# Patient Record
Sex: Female | Born: 1945 | Race: White | Hispanic: No | Marital: Married | Smoking: Never smoker
Health system: Southern US, Community
[De-identification: ages and names within clinical notes are randomized; demographics above are authoritative.]

## PROBLEM LIST (undated history)

## (undated) DIAGNOSIS — K760 Fatty (change of) liver, not elsewhere classified: Secondary | ICD-10-CM

## (undated) DIAGNOSIS — K219 Gastro-esophageal reflux disease without esophagitis: Secondary | ICD-10-CM

## (undated) DIAGNOSIS — C439 Malignant melanoma of skin, unspecified: Secondary | ICD-10-CM

## (undated) DIAGNOSIS — M1990 Unspecified osteoarthritis, unspecified site: Secondary | ICD-10-CM

## (undated) DIAGNOSIS — E7800 Pure hypercholesterolemia, unspecified: Secondary | ICD-10-CM

## (undated) HISTORY — DX: Malignant melanoma of skin, unspecified: C43.9

## (undated) HISTORY — DX: Unspecified osteoarthritis, unspecified site: M19.90

## (undated) HISTORY — DX: Pure hypercholesterolemia, unspecified: E78.00

## (undated) HISTORY — PX: DIAPHRAGMATIC HERNIA REPAIR: SHX413

## (undated) HISTORY — DX: Fatty (change of) liver, not elsewhere classified: K76.0

## (undated) HISTORY — DX: Gastro-esophageal reflux disease without esophagitis: K21.9

---

## 1985-03-10 HISTORY — PX: APPENDECTOMY: SHX54

## 1985-03-10 HISTORY — PX: ABDOMINAL HYSTERECTOMY: SHX81

## 2008-01-12 ENCOUNTER — Ambulatory Visit: Payer: Self-pay | Admitting: Cardiology

## 2009-11-28 ENCOUNTER — Ambulatory Visit: Payer: Self-pay | Admitting: Cardiology

## 2010-06-13 DIAGNOSIS — R079 Chest pain, unspecified: Secondary | ICD-10-CM

## 2011-06-05 DIAGNOSIS — H0230 Blepharochalasis unspecified eye, unspecified eyelid: Secondary | ICD-10-CM | POA: Insufficient documentation

## 2011-08-21 DIAGNOSIS — M2240 Chondromalacia patellae, unspecified knee: Secondary | ICD-10-CM | POA: Insufficient documentation

## 2011-08-21 DIAGNOSIS — Z9889 Other specified postprocedural states: Secondary | ICD-10-CM | POA: Insufficient documentation

## 2011-08-21 DIAGNOSIS — S83249A Other tear of medial meniscus, current injury, unspecified knee, initial encounter: Secondary | ICD-10-CM | POA: Insufficient documentation

## 2011-08-21 DIAGNOSIS — M179 Osteoarthritis of knee, unspecified: Secondary | ICD-10-CM | POA: Insufficient documentation

## 2012-10-01 ENCOUNTER — Encounter: Payer: Self-pay | Admitting: Gastroenterology

## 2012-10-29 ENCOUNTER — Ambulatory Visit: Payer: Medicare Other | Admitting: Gastroenterology

## 2013-01-08 HISTORY — PX: CHOLECYSTECTOMY: SHX55

## 2014-05-05 ENCOUNTER — Encounter: Payer: Self-pay | Admitting: Gastroenterology

## 2014-06-21 ENCOUNTER — Ambulatory Visit (INDEPENDENT_AMBULATORY_CARE_PROVIDER_SITE_OTHER): Payer: Medicare Other | Admitting: Gastroenterology

## 2014-06-21 ENCOUNTER — Encounter: Payer: Self-pay | Admitting: Gastroenterology

## 2014-06-21 VITALS — BP 148/82 | HR 72 | Ht 63.75 in | Wt 190.1 lb

## 2014-06-21 DIAGNOSIS — R1010 Upper abdominal pain, unspecified: Secondary | ICD-10-CM | POA: Diagnosis not present

## 2014-06-21 DIAGNOSIS — G8929 Other chronic pain: Secondary | ICD-10-CM

## 2014-06-21 DIAGNOSIS — Z1211 Encounter for screening for malignant neoplasm of colon: Secondary | ICD-10-CM | POA: Diagnosis not present

## 2014-06-21 DIAGNOSIS — R1011 Right upper quadrant pain: Principal | ICD-10-CM

## 2014-06-21 MED ORDER — HYOSCYAMINE SULFATE 0.125 MG SL SUBL: 0.2500 mg | SUBLINGUAL_TABLET | SUBLINGUAL | Status: DC | PRN

## 2014-06-21 MED ORDER — SULINDAC 200 MG PO TABS: ORAL_TABLET | ORAL | Status: DC

## 2014-06-21 MED ORDER — PANTOPRAZOLE SODIUM 40 MG PO TBEC: 40.0000 mg | DELAYED_RELEASE_TABLET | Freq: Every day | ORAL | Status: DC

## 2014-06-21 NOTE — Progress Notes (Signed)
_                                                                                                                History of Present Illness:  Courtney Harper is a 69 year old white female referred at the request of Dr. Hampton Abbot for evaluation of abdominal pain.  Several months she's been pulling of right upper quadrant pain that may radiate to her upper midepigastrium.  It is usually preceded by belching.  It is often postprandial and occasionally awakens her.  Is also elicited with physical activity.  It is not positionally-related, per se.  It is somewhat similar to pain she had prior to her cholecystectomy.  She underwent cholecystectomy in November, 2014 right upper quadrant pain.  HIDA scan and ultrasound apparently were normal.  She denies pyrosis ,nausea, or dysphagia.  There is been no change of bowel habits.  Last colonoscopy was over 10 years ago.  Endoscopy in 2014 was normal.  She status post repair of a diaphragmatic hernia.   Past Medical History  Diagnosis Date  . Melanoma   . Hypercholesteremia   . GERD (gastroesophageal reflux disease)   . Arthritis   . Fatty liver    Past Surgical History  Procedure Laterality Date  . Cholecystectomy  01/2013  . Appendectomy  1987  . Abdominal hysterectomy  1987   family history includes Colon cancer in her maternal uncle; Diabetes in her father; Heart disease in her mother; Kidney disease in her father. There is no history of Colon polyps, Gallbladder disease, or Esophageal cancer. Current Outpatient Prescriptions  Medication Sig Dispense Refill  . simvastatin (ZOCOR) 40 MG tablet Take 40 mg by mouth daily.      No current facility-administered medications for this visit.   Allergies as of 06/21/2014  . (No Known Allergies)    reports that she has never smoked. She has never used smokeless tobacco. She reports that she does not drink alcohol or use illicit drugs.   Review of Systems: Pertinent positive and negative review  of systems were noted in the above HPI section. All other review of systems were otherwise negative.  Vital signs were reviewed in today's medical record Physical Exam: General: Well developed , well nourished, no acute distress Skin: anicteric Head: Normocephalic and atraumatic Eyes:  sclerae anicteric, EOMI Ears: Normal auditory acuity Mouth: No deformity or lesions Neck: Supple, no masses or thyromegaly Lungs: Clear throughout to auscultation Heart: Regular rate and rhythm; no murmurs, rubs or bruits Abdomen: Soft,  and non distended. No masses, hepatosplenomegaly or hernias noted. Normal Bowel sounds.  There is tenderness to palpation in the right subcostal area that mimics her pain Rectal:deferredadminister Musculoskeletal: Symmetrical with no gross deformities  Skin: No lesions on visible extremities Pulses:  Normal pulses noted Extremities: No clubbing, cyanosis, edema or deformities noted Neurological: Alert oriented x 4, grossly nonfocal Cervical Nodes:  No significant cervical adenopathy Inguinal Nodes: No significant inguinal adenopathy Psychological:  Alert and cooperative. Normal mood and affect  See Assessment  and Plan under Problem List

## 2014-06-21 NOTE — Patient Instructions (Signed)
Follow up on 08/16/2014 at 9:15am with Dr Deatra Ina We will send in your prescription to your pharmacy

## 2014-06-21 NOTE — Assessment & Plan Note (Signed)
Several month history of recurrent right upper quadrant pain that is preceded by belching.  There may be a positional component or exercise component to it.  Exam is pertinent for subcostal tenderness that mimics pain.  This raises the question of musculoskeletal pain.  That symptoms are preceded by belching also raises the question of dyspepsia.  She may have adhesions from prior surgery.  Choledocholithiasis and biliary dyskinesia are less likely.  Recommendations #1 trial of Clinoril 200 mg twice a day for 2 weeks, Protonix daily, and hyomax when necessary #2 if not improved may consider upper endoscopy and abdominal ultrasound

## 2014-06-21 NOTE — Assessment & Plan Note (Signed)
She is due for screening colonoscopy.  Will defer until abdominal pain issues are resolved.

## 2014-07-05 ENCOUNTER — Encounter: Payer: Self-pay | Admitting: Physician Assistant

## 2014-07-05 ENCOUNTER — Telehealth: Payer: Self-pay | Admitting: Gastroenterology

## 2014-07-05 NOTE — Telephone Encounter (Signed)
ok 

## 2014-07-05 NOTE — Telephone Encounter (Signed)
She had a CT done for Oncology to r/o cancerous lesion in the abd/pelvis. CT was done in [place]. She was told she has an abdominal wall hernia. She also reports her abdominal pain is not improving despite medications. She is concerned she needs surgery. I offered her an appointment with you in May. She wants to be seen earlier and asked me to schedule her with an extender which I have done. She is getting the CT report sent to [place].

## 2014-07-09 ENCOUNTER — Encounter: Payer: Self-pay | Admitting: Physician Assistant

## 2014-07-11 ENCOUNTER — Encounter: Payer: Self-pay | Admitting: Physician Assistant

## 2014-07-14 ENCOUNTER — Ambulatory Visit: Payer: Medicare Other | Admitting: Physician Assistant

## 2014-07-17 ENCOUNTER — Ambulatory Visit (INDEPENDENT_AMBULATORY_CARE_PROVIDER_SITE_OTHER): Payer: Medicare Other | Admitting: Physician Assistant

## 2014-07-17 ENCOUNTER — Encounter: Payer: Self-pay | Admitting: Physician Assistant

## 2014-07-17 ENCOUNTER — Ambulatory Visit (HOSPITAL_COMMUNITY)
Admission: RE | Admit: 2014-07-17 | Discharge: 2014-07-17 | Disposition: A | Discharge status: Home / Self Care | Payer: Medicare Other | Source: Ambulatory Visit | Attending: Physician Assistant | Admitting: Physician Assistant

## 2014-07-17 VITALS — BP 138/80 | HR 78 | Ht 63.75 in | Wt 191.0 lb

## 2014-07-17 DIAGNOSIS — R1013 Epigastric pain: Secondary | ICD-10-CM | POA: Diagnosis not present

## 2014-07-17 DIAGNOSIS — R0781 Pleurodynia: Secondary | ICD-10-CM | POA: Diagnosis present

## 2014-07-17 DIAGNOSIS — J90 Pleural effusion, not elsewhere classified: Secondary | ICD-10-CM | POA: Insufficient documentation

## 2014-07-17 DIAGNOSIS — R1011 Right upper quadrant pain: Secondary | ICD-10-CM

## 2014-07-17 DIAGNOSIS — Z85820 Personal history of malignant melanoma of skin: Secondary | ICD-10-CM | POA: Insufficient documentation

## 2014-07-17 DIAGNOSIS — Z9049 Acquired absence of other specified parts of digestive tract: Secondary | ICD-10-CM | POA: Diagnosis not present

## 2014-07-17 DIAGNOSIS — K449 Diaphragmatic hernia without obstruction or gangrene: Secondary | ICD-10-CM | POA: Insufficient documentation

## 2014-07-17 MED ORDER — PANTOPRAZOLE SODIUM 40 MG PO TBEC: 40.0000 mg | DELAYED_RELEASE_TABLET | Freq: Every day | ORAL | Status: DC

## 2014-07-17 NOTE — Progress Notes (Signed)
Patient ID: Courtney Harper, female   DOB: May 12, 1945, 69 y.o.   MRN: [id number]   Subjective:    Patient ID: Courtney Harper, female    DOB: 29-May-1945, 69 y.o.   MRN: [id number]  HPI Courtney Harper is a 69 year old white female new to Dr. Deatra Harper and initially seen in the office on 06/21/2014. At that time in referral from Dr. Hampton Harper for evaluation of right upper quadrant pain which she says started in January. She is status post cholecystectomy in 2014 and also had repair of a diaphragmatic hernia on the right several years ago. Her history is also positive for history of malignant melanoma in tablet 2002 with is followed by an oncologist Dr. Kendall Harper in [place]. Patient says she has been having ongoing pain which has progressed, and is worse, over the past 5 months. Dr. Deatra Harper had given her a trial of Protonix and Clinoril for what was felt to be musculoskeletal pain. She says she only took one of the Clinoril and it made her stomach hurt worse so she stopped. She took the Protonix for 2 weeks but did not notice any difference in her pain. She has since had labs done elsewhere on or 26 2016 with a normal CBC and see met. She had CT of the abdomen and pelvis done at that same time he got a report with her today and this does show some small fat-containing ventral abdominal wall hernias liver within normal limits Lein within normal limits small hiatal hernia and colonic diverticulosis. No findings to explain her right upper quadrant pain, no obstruction and no evidence of recurrent diaphragmatic hernia. Sliding States that she is hurting constantly in that this seems to be worse with certain movements, bending vacuuming etc. and sitting for prolonged periods especially in the car. She has not noticed any definite change with by mouth intake. No changes in her bowel habits. No nausea or vomiting. She relates that she had an EGD in 2014 in [place] prior to her gallbladder removal and that that was  unremarkable. She had colonoscopy at least 10 years ago in [place] which was negative and says she did a colon guard through Dr. Hampton Harper within the past year also negative.  Review of Systems Pertinent positive and negative review of systems were noted in the above HPI section.  All other review of systems was otherwise negative.  Outpatient Encounter Prescriptions as of 07/17/2014  Medication Sig  . simvastatin (ZOCOR) 40 MG tablet Take 40 mg by mouth daily.   . pantoprazole (PROTONIX) 40 MG tablet Take 1 tablet (40 mg total) by mouth daily.  . [DISCONTINUED] hyoscyamine (LEVSIN SL) 0.125 MG SL tablet Place 2 tablets (0.25 mg total) under the tongue every 4 (four) hours as needed.  . [DISCONTINUED] pantoprazole (PROTONIX) 40 MG tablet Take 1 tablet (40 mg total) by mouth daily.  . [DISCONTINUED] sulindac (CLINORIL) 200 MG tablet Take one tab twice a day for 2 weeks, and then as needed, abdominal pain   No facility-administered encounter medications on file as of 07/17/2014.   No Known Allergies Patient Active Problem List   Diagnosis Date Noted  . Hx of malignant melanoma of skin 07/17/2014  . Diaphragmatic hernia 07/17/2014  . S/P cholecystectomy 07/17/2014  . Abdominal pain, chronic, right upper quadrant 06/21/2014  . Colon cancer screening 06/21/2014   History   Social History  . Marital Status: Married    Spouse Name: N/A  . Number of Children: 1  . Years of  Education: N/A   Occupational History  . Retired    Social History Main Topics  . Smoking status: Never Smoker   . Smokeless tobacco: Never Used  . Alcohol Use: No  . Drug Use: No  . Sexual Activity: Not on file   Other Topics Concern  . Not on file   Social History Narrative    Ms. Courtney Harper's family history includes Diabetes in her father; Heart disease in her mother; Kidney disease in her father; Rectal cancer in her maternal uncle. There is no history of Colon polyps, Gallbladder disease, or Esophageal cancer.       Objective:    Filed Vitals:   07/17/14 1415  BP: 138/80  Pulse: 78    Physical Exam  well-developed older white female in no acute distress, accompanied by her husband blood pressure 138/80 pulse 78 height 5 foot 3 weight 191, BMI 3. HEENT: nontraumatic normocephalic EOMI PERRLA sclera anicteric, Supple: no JVD, Cardiovascular :regular rate and rhythm with S1-S2 no murmur or gallop, Pulmonary; clear bilaterally, Abdomen: soft, bowel sounds are present she is tender in the right upper quadrant is no guarding or rebound no palpable mass or hepatosplenomegaly she is also tender to palpation over the right lower anterior lateral and posterior rib margin, Rectal :exam not done, Extremities: no clubbing cyanosis or edema skin warm and dry       Assessment & Plan:   #1 69 yo female with 5 month hx of progressive RUQ pain- workup this far unrevealing- she has some musculoskeletal component , but is also tender in her abdomen. Etiology not clear No evidence of recurrent diaphragmatic hernia on Ct and small fat containing abdominal wall hernias should not cause current sxs,  #2 hx of malignant melanoma -back with + nodes 12 years ago #3 s/p GB #4 s/p repair of diaphragmatic hernia  Plan; Restart Protonix 40 mg daily Schedule for EGD asap with Dr Courtney Harper. Procedure discussed in detail with pt and she is agreeable to proceed. Schedule for right  rib details today  If above unrevealing she may need Ct of chest /thoracic spine   Courtney S Esterwood PA-C 07/17/2014   Cc: Courtney Palms, MD

## 2014-07-17 NOTE — Patient Instructions (Addendum)
You have been scheduled for an endoscopy. Please follow written instructions given to you at your visit today. If you use inhalers (even only as needed), please bring them with you on the day of your procedure. Your physician has requested that you go to www.startemmi.com and enter the access code given to you at your visit today. This web site gives a general overview about your procedure. However, you should still follow specific instructions given to you by our office regarding your preparation for the procedure.  We sent a prescription for Pantoprazole sodium 40 mg refills to [facility], [address], [address], [place], [place].  We scheduled Right rib x-rays at [facility]. You can go to the Radiology department and give then your name and Date of birth.

## 2014-07-18 ENCOUNTER — Other Ambulatory Visit: Payer: Self-pay

## 2014-07-18 ENCOUNTER — Ambulatory Visit (AMBULATORY_SURGERY_CENTER): Payer: Medicare Other | Admitting: Gastroenterology

## 2014-07-18 ENCOUNTER — Encounter: Payer: Self-pay | Admitting: Gastroenterology

## 2014-07-18 VITALS — BP 131/78 | HR 70 | Temp 97.7°F | Resp 21 | Ht 63.0 in | Wt 191.0 lb

## 2014-07-18 DIAGNOSIS — G8929 Other chronic pain: Secondary | ICD-10-CM

## 2014-07-18 DIAGNOSIS — R1013 Epigastric pain: Secondary | ICD-10-CM | POA: Diagnosis not present

## 2014-07-18 DIAGNOSIS — R1011 Right upper quadrant pain: Principal | ICD-10-CM

## 2014-07-18 DIAGNOSIS — J90 Pleural effusion, not elsewhere classified: Secondary | ICD-10-CM

## 2014-07-18 DIAGNOSIS — Z85820 Personal history of malignant melanoma of skin: Secondary | ICD-10-CM

## 2014-07-18 MED ORDER — SODIUM CHLORIDE 0.9 % IV SOLN: 500.0000 mL | INTRAVENOUS | Status: DC

## 2014-07-18 NOTE — Progress Notes (Signed)
A/ox3 pleased with MAC, report to Annette RN 

## 2014-07-18 NOTE — Op Note (Addendum)
[facility]  [address]. [place], [address]   ENDOSCOPY PROCEDURE REPORT  PATIENT: Courtney Harper, Courtney Harper  MR#: [id number] BIRTHDATE: Dec 21, 1945 , 13  yrs. old GENDER: female ENDOSCOPIST: Inda Castle, MD REFERRED BY: PROCEDURE DATE:  07/18/2014 PROCEDURE:  EGD, diagnostic ASA CLASS:     Class II INDICATIONS:  abdominal pain in the upper right quadrant. MEDICATIONS: Monitored anesthesia care and Propofol 160 mg IV TOPICAL ANESTHETIC:  DESCRIPTION OF PROCEDURE: After the risks benefits and alternatives of the procedure were thoroughly explained, informed consent was obtained.  The LB [id number] endoscope was introduced through the mouth and advanced to the second portion of the duodenum , Without limitations.  The instrument was slowly withdrawn as the mucosa was fully examined.      ESOPHAGUS: There was LA Class B esophagitis (One or more mucosal breaks > 26mm, but without continuity across mucosal folds) noted. There was a peptic stricture at the gastroesophageal junction.  The stricture was easily traversable.  Retroflexed views revealed no abnormalities.     The scope was then withdrawn from the patient and the procedure completed.  COMPLICATIONS: There were no immediate complications.  ENDOSCOPIC IMPRESSION: 1.   There was LA Class B esophagitis noted 2.   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

## 2014-07-18 NOTE — Progress Notes (Signed)
No problems noted in the recovery room. maw 

## 2014-07-18 NOTE — Progress Notes (Signed)
Beth, RN called back to recovery and spoke with Earnestine Mealing, RN reported she will speak with Dr. Deatra Ina re; CT of chest and call the pt back with the appointment. Information passed to the pt. maw

## 2014-07-18 NOTE — Progress Notes (Signed)
Reviewed and agree with management. Mirissa Lopresti D. Angelyse Heslin, M.D., FACG  

## 2014-07-18 NOTE — Patient Instructions (Signed)
YOU HAD AN ENDOSCOPIC PROCEDURE TODAY AT [facility]:   Refer to the procedure report that was given to you for any specific questions about what was found during the examination.  If the procedure report does not answer your questions, please call your gastroenterologist to clarify.  If you requested that your care partner not be given the details of your procedure findings, then the procedure report has been included in a sealed envelope for you to review at your convenience later.  YOU SHOULD EXPECT: Some feelings of bloating in the abdomen. Passage of more gas than usual.  Walking can help get rid of the air that was put into your GI tract during the procedure and reduce the bloating. If you had a lower endoscopy (such as a colonoscopy or flexible sigmoidoscopy) you may notice spotting of blood in your stool or on the toilet paper. If you underwent a bowel prep for your procedure, you may not have a normal bowel movement for a few days.  Please Note:  You might notice some irritation and congestion in your nose or some drainage.  This is from the oxygen used during your procedure.  There is no need for concern and it should clear up in a day or so.  SYMPTOMS TO REPORT IMMEDIATELY:   Following upper endoscopy (EGD)  Vomiting of blood or coffee ground material  New chest pain or pain under the shoulder blades  Painful or persistently difficult swallowing  New shortness of breath  Fever of 100F or higher  Black, tarry-looking stools  For urgent or emergent issues, a gastroenterologist can be reached at any hour by calling [phone].   DIET: Your first meal following the procedure should be a small meal and then it is ok to progress to your normal diet. Heavy or fried foods are harder to digest and may make you feel nauseous or bloated.  Likewise, meals heavy in dairy and vegetables can increase bloating.  Drink plenty of fluids but you should avoid alcoholic beverages for  24 hours.  ACTIVITY:  You should plan to take it easy for the rest of today and you should NOT DRIVE or use heavy machinery until tomorrow (because of the sedation medicines used during the test).    FOLLOW UP: Our staff will call the number listed on your records the next business day following your procedure to check on you and address any questions or concerns that you may have regarding the information given to you following your procedure. If we do not reach you, we will leave a message.  However, if you are feeling well and you are not experiencing any problems, there is no need to return our call.  We will assume that you have returned to your regular daily activities without incident.  If any biopsies were taken you will be contacted by phone or by letter within the next 1-3 weeks.  Please call us at [phone] if you have not heard about the biopsies in 3 weeks.    SIGNATURES/CONFIDENTIALITY: You and/or your care partner have signed paperwork which will be entered into your electronic medical record.  These signatures attest to the fact that that the information above on your After Visit Summary has been reviewed and is understood.  Full responsibility of the confidentiality of this discharge information lies with you and/or your care-partner.    Handouts were given to your care partner on esophagitis. You may resume your current medications today. CT of  chest with contrast for right upper quadrant pain. Please call if any questions or concerns.

## 2014-07-19 ENCOUNTER — Telehealth: Payer: Self-pay | Admitting: *Deleted

## 2014-07-19 NOTE — Telephone Encounter (Signed)
  Follow up Call-  Call back number 07/18/2014  Post procedure Call Back phone  # [phone]  Permission to leave phone message Yes     Patient questions:  Do you have a fever, pain , or abdominal swelling? No. Pain Score  0 *  Have you tolerated food without any problems? Yes.    Have you been able to return to your normal activities? Yes.    Do you have any questions about your discharge instructions: Diet   No. Medications  No. Follow up visit  No.  Do you have questions or concerns about your Care? No.  Actions: * If pain score is 4 or above: No action needed, pain <4.

## 2014-07-20 ENCOUNTER — Ambulatory Visit: Payer: Medicare Other | Admitting: Physician Assistant

## 2014-07-21 ENCOUNTER — Ambulatory Visit (INDEPENDENT_AMBULATORY_CARE_PROVIDER_SITE_OTHER)
Admission: RE | Admit: 2014-07-21 | Discharge: 2014-07-21 | Disposition: A | Discharge status: Home / Self Care | Payer: Medicare Other | Source: Ambulatory Visit | Attending: Gastroenterology | Admitting: Gastroenterology

## 2014-07-21 DIAGNOSIS — R1011 Right upper quadrant pain: Principal | ICD-10-CM

## 2014-07-21 DIAGNOSIS — Z85820 Personal history of malignant melanoma of skin: Secondary | ICD-10-CM

## 2014-07-21 DIAGNOSIS — R1010 Upper abdominal pain, unspecified: Secondary | ICD-10-CM | POA: Diagnosis not present

## 2014-07-21 DIAGNOSIS — G8929 Other chronic pain: Secondary | ICD-10-CM | POA: Diagnosis not present

## 2014-07-21 DIAGNOSIS — J90 Pleural effusion, not elsewhere classified: Secondary | ICD-10-CM | POA: Diagnosis not present

## 2014-07-21 MED ORDER — IOHEXOL 300 MG/ML  SOLN
80.0000 mL | Freq: Once | INTRAMUSCULAR | Status: AC | PRN
  Administered 2014-07-21: 80 mL via INTRAVENOUS

## 2014-07-25 ENCOUNTER — Encounter: Payer: Self-pay | Admitting: Physician Assistant

## 2014-07-27 ENCOUNTER — Telehealth: Payer: Self-pay | Admitting: Gastroenterology

## 2014-07-28 NOTE — Telephone Encounter (Signed)
Advised the report is in but the doctor has not reviewed it. She specifically asks about pleural effusion. Advised the reports states no pleural effusion and lungs are clear.

## 2014-07-31 ENCOUNTER — Encounter: Payer: Self-pay | Admitting: Physician Assistant

## 2014-08-16 ENCOUNTER — Encounter: Payer: Self-pay | Admitting: Gastroenterology

## 2014-08-16 ENCOUNTER — Ambulatory Visit (INDEPENDENT_AMBULATORY_CARE_PROVIDER_SITE_OTHER): Payer: Medicare Other | Admitting: Gastroenterology

## 2014-08-16 ENCOUNTER — Other Ambulatory Visit (INDEPENDENT_AMBULATORY_CARE_PROVIDER_SITE_OTHER): Payer: Medicare Other

## 2014-08-16 ENCOUNTER — Telehealth: Payer: Self-pay | Admitting: Gastroenterology

## 2014-08-16 ENCOUNTER — Ambulatory Visit (INDEPENDENT_AMBULATORY_CARE_PROVIDER_SITE_OTHER)
Admission: RE | Admit: 2014-08-16 | Discharge: 2014-08-16 | Disposition: A | Discharge status: Home / Self Care | Payer: Medicare Other | Source: Ambulatory Visit | Attending: Gastroenterology | Admitting: Gastroenterology

## 2014-08-16 VITALS — BP 124/76 | HR 68 | Ht 63.75 in | Wt 189.2 lb

## 2014-08-16 DIAGNOSIS — R1011 Right upper quadrant pain: Secondary | ICD-10-CM | POA: Diagnosis not present

## 2014-08-16 DIAGNOSIS — IMO0001 Reserved for inherently not codable concepts without codable children: Secondary | ICD-10-CM

## 2014-08-16 DIAGNOSIS — R079 Chest pain, unspecified: Secondary | ICD-10-CM

## 2014-08-16 DIAGNOSIS — R54 Age-related physical debility: Secondary | ICD-10-CM

## 2014-08-16 DIAGNOSIS — R0781 Pleurodynia: Secondary | ICD-10-CM | POA: Diagnosis not present

## 2014-08-16 DIAGNOSIS — R1010 Upper abdominal pain, unspecified: Secondary | ICD-10-CM

## 2014-08-16 DIAGNOSIS — Z79899 Other long term (current) drug therapy: Secondary | ICD-10-CM

## 2014-08-16 DIAGNOSIS — Z85820 Personal history of malignant melanoma of skin: Secondary | ICD-10-CM

## 2014-08-16 DIAGNOSIS — J90 Pleural effusion, not elsewhere classified: Secondary | ICD-10-CM

## 2014-08-16 DIAGNOSIS — G8929 Other chronic pain: Secondary | ICD-10-CM

## 2014-08-16 LAB — SEDIMENTATION RATE: Sed Rate: 29 mm/hr — ABNORMAL HIGH (ref 0–22)

## 2014-08-16 LAB — COMPREHENSIVE METABOLIC PANEL
ALBUMIN: 4.2 g/dL (ref 3.5–5.2)
ALT: 18 U/L (ref 0–35)
AST: 20 U/L (ref 0–37)
Alkaline Phosphatase: 137 U/L — ABNORMAL HIGH (ref 39–117)
BUN: 17 mg/dL (ref 6–23)
CO2: 31 meq/L (ref 19–32)
Calcium: 10 mg/dL (ref 8.4–10.5)
Chloride: 104 mEq/L (ref 96–112)
Creatinine, Ser: 0.91 mg/dL (ref 0.40–1.20)
GFR: 65.05 mL/min (ref >60.00)
Glucose, Bld: 93 mg/dL (ref 70–99)
POTASSIUM: 4.8 meq/L (ref 3.5–5.1)
Sodium: 137 mEq/L (ref 135–145)
Total Bilirubin: 0.6 mg/dL (ref 0.2–1.2)
Total Protein: 7.6 g/dL (ref 6.0–8.3)

## 2014-08-16 LAB — CBC WITH DIFFERENTIAL/PLATELET
Basophils Absolute: 0 10*3/uL (ref 0.0–0.1)
Basophils Relative: 0.6 % (ref 0.0–3.0)
Eosinophils Absolute: 0.2 10*3/uL (ref 0.0–0.7)
Eosinophils Relative: 2.3 % (ref 0.0–5.0)
HCT: 43.2 % (ref 36.0–46.0)
Hemoglobin: 14.8 g/dL (ref 12.0–15.0)
LYMPHS ABS: 2.1 10*3/uL (ref 0.7–4.0)
Lymphocytes Relative: 30.5 % (ref 12.0–46.0)
MCHC: 34.1 g/dL (ref 30.0–36.0)
MCV: 86.5 fl (ref 78.0–100.0)
MONOS PCT: 6.2 % (ref 3.0–12.0)
Monocytes Absolute: 0.4 10*3/uL (ref 0.1–1.0)
Neutro Abs: 4.1 10*3/uL (ref 1.4–7.7)
Neutrophils Relative %: 60.4 % (ref 43.0–77.0)
Platelets: 192 10*3/uL (ref 150.0–400.0)
RBC: 5 Mil/uL (ref 3.87–5.11)
RDW: 13 % (ref 11.5–15.5)
WBC: 6.8 10*3/uL (ref 4.0–10.5)

## 2014-08-16 LAB — BUN: BUN: 17 mg/dL (ref 6–23)

## 2014-08-16 LAB — CREATININE, SERUM: Creatinine, Ser: 0.92 mg/dL (ref 0.40–1.20)

## 2014-08-16 MED ORDER — METHYLPREDNISOLONE 4 MG PO TBPK: ORAL_TABLET | ORAL | Status: DC

## 2014-08-16 NOTE — Assessment & Plan Note (Signed)
Worsening right upper quadrant pain which increasingly appears 3 musculoskeletal in origin.  Workup including chest and abdominal CT scans and rib films were negative.  Patient also has some dyspnea on exertion that is unexplained.  Otic bone lesions are concern although not evident by x-ray.  Nerve entrapment is less likely since she has point tenderness to palpation.  Recommendations #1 CBC and comprehensive metabolic profile #2 bone scan #3 trial of Medrol dosepak

## 2014-08-16 NOTE — Progress Notes (Signed)
      History of Present Illness:  Courtney Harper continues to have pain in the right upper quadrant that is worsening.  She has pain with any activity in the right subcostal area that radiates to the subyphoid process.  Pain is worsened with movement and activity.  She took just a few doses of Clinoril which helped the pain because gastric distress causing her to discontinue this medication.  She is also complaining of dyspnea on exertion.  With films were negative.  Chest CT scan was unremarkable except for some slightly prominent lymph nodes.  Previous abdominal CT was negative by report.    Review of Systems: Pertinent positive and negative review of systems were noted in the above HPI section. All other review of systems were otherwise negative.    Current Medications, Allergies, Past Medical History, Past Surgical History, Family History and Social History were reviewed in [place] record  Vital signs were reviewed in today's medical record. Physical Exam: General: Well developed , well nourished, no acute distress Skin: anicteric Head: Normocephalic and atraumatic Eyes:  sclerae anicteric, EOMI Ears: Normal auditory acuity Mouth: No deformity or lesions Lymph Nodes: no lymphadenopathy Lungs: Clear throughout to auscultation Heart: Regular rate and rhythm; no murmurs, rubs or brui: Gastroinestinal:  Soft, non tender and non distended. No masses, hepatosplenomegaly or hernias noted. Normal Bowel sounds Rectal:deferred Musculoskeletal: Symmetrical with no gross deformities.  She is exquisitely tender along the right subcostal area up to and including the subxiphoid process Pulses:  Normal pulses noted Extremities: No clubbing, cyanosis, edema or deformities noted Neurological: Alert oriented x 4, grossly nonfocal Psychological:  Alert and cooperative. Normal mood and affect  See Assessment and Plan under Problem List

## 2014-08-16 NOTE — Patient Instructions (Signed)
Go to the basement for labs today You have been scheduled for a Bone Density  Scan on 08/18/2014 at 9am

## 2014-08-18 ENCOUNTER — Other Ambulatory Visit: Payer: Medicare Other

## 2014-08-18 NOTE — Telephone Encounter (Signed)
Please order a nuclear medicine bone scan.  You can inform her that there is no significant osteoporosis and that calcium 1200 mg per day and vitamin D 800 units per day are advised.

## 2014-08-18 NOTE — Telephone Encounter (Signed)
So what to do now? Tell her this test was normal. She has already had this test. Do I schedule her for the other? Im not sure what to do here   I sent you a staff message back on this patient

## 2014-08-21 NOTE — Telephone Encounter (Signed)
Dr Deatra Ina, They have Nuclear Bone scan 3 phase  Upper or lower or Limitied which one?

## 2014-08-21 NOTE — Telephone Encounter (Signed)
Whichever one covers the rib cage

## 2014-08-25 ENCOUNTER — Encounter: Payer: Self-pay | Admitting: Physician Assistant

## 2014-08-25 ENCOUNTER — Other Ambulatory Visit: Payer: Self-pay | Admitting: *Deleted

## 2014-08-25 ENCOUNTER — Telehealth: Payer: Self-pay | Admitting: *Deleted

## 2014-08-25 DIAGNOSIS — Z9049 Acquired absence of other specified parts of digestive tract: Secondary | ICD-10-CM

## 2014-08-25 DIAGNOSIS — G8929 Other chronic pain: Secondary | ICD-10-CM

## 2014-08-25 DIAGNOSIS — R1011 Right upper quadrant pain: Principal | ICD-10-CM

## 2014-08-25 NOTE — Telephone Encounter (Signed)
=  View-only below this line===   ----- Message -----    From: Courtney Harper [name]    Sent: 08/25/2014 11:20 AM EDT      To: [name], PA-C Subject: Non-Urgent Medical Question  Dr Deatra Ina asked me to call after I finished a methylprednisone pack  I left a message yesterday for his Nurse but did not hear back from Her I was concerned She did not get my message Unfortunately the medication has not relieved my pain  The pain level is getting stronger especially with activity and  waking me from sleeping if I get on my right side  Is it feasible to have a hida scan to rule out any chance  of problems with the bile duct since I had gallbladder surgery Thanks Courtney Harper      Per Amy schedule MRCP  Patient aware of Date and Time

## 2014-08-25 NOTE — Telephone Encounter (Signed)
Patient is scheduled at [facility] radiology on 09/01/2014 Friday 7am arrive 15 minutes early  Nothing to eat or drink after midnight   Patient aware

## 2014-09-01 ENCOUNTER — Other Ambulatory Visit: Payer: Self-pay | Admitting: Physician Assistant

## 2014-09-01 ENCOUNTER — Ambulatory Visit (HOSPITAL_COMMUNITY)
Admission: RE | Admit: 2014-09-01 | Discharge: 2014-09-01 | Disposition: A | Discharge status: Home / Self Care | Payer: Medicare Other | Source: Ambulatory Visit | Attending: Physician Assistant | Admitting: Physician Assistant

## 2014-09-01 DIAGNOSIS — D1771 Benign lipomatous neoplasm of kidney: Secondary | ICD-10-CM | POA: Insufficient documentation

## 2014-09-01 DIAGNOSIS — G8929 Other chronic pain: Secondary | ICD-10-CM | POA: Diagnosis not present

## 2014-09-01 DIAGNOSIS — Z9049 Acquired absence of other specified parts of digestive tract: Secondary | ICD-10-CM | POA: Diagnosis not present

## 2014-09-01 DIAGNOSIS — R1011 Right upper quadrant pain: Secondary | ICD-10-CM

## 2014-09-01 MED ORDER — GADOBENATE DIMEGLUMINE 529 MG/ML IV SOLN
17.0000 mL | Freq: Once | INTRAVENOUS | Status: AC | PRN
  Administered 2014-09-01: 17 mL via INTRAVENOUS

## 2014-09-04 ENCOUNTER — Encounter: Payer: Self-pay | Admitting: Physician Assistant

## 2014-09-07 ENCOUNTER — Other Ambulatory Visit: Payer: Self-pay

## 2014-09-07 DIAGNOSIS — C439 Malignant melanoma of skin, unspecified: Secondary | ICD-10-CM

## 2014-09-07 DIAGNOSIS — R0781 Pleurodynia: Secondary | ICD-10-CM

## 2014-09-14 ENCOUNTER — Ambulatory Visit (HOSPITAL_COMMUNITY)
Admission: RE | Admit: 2014-09-14 | Discharge: 2014-09-14 | Disposition: A | Discharge status: Home / Self Care | Payer: Medicare Other | Source: Ambulatory Visit | Attending: Physician Assistant | Admitting: Physician Assistant

## 2014-09-14 DIAGNOSIS — C439 Malignant melanoma of skin, unspecified: Secondary | ICD-10-CM

## 2014-09-14 DIAGNOSIS — R0781 Pleurodynia: Secondary | ICD-10-CM | POA: Diagnosis present

## 2014-09-14 MED ORDER — TECHNETIUM TC 99M MEDRONATE IV KIT
25.0000 | PACK | Freq: Once | INTRAVENOUS | Status: AC | PRN
  Administered 2014-09-14: 26.3 via INTRAVENOUS

## 2015-03-28 DIAGNOSIS — M1611 Unilateral primary osteoarthritis, right hip: Secondary | ICD-10-CM | POA: Insufficient documentation

## 2015-05-21 DIAGNOSIS — E7800 Pure hypercholesterolemia, unspecified: Secondary | ICD-10-CM | POA: Insufficient documentation

## 2015-05-21 DIAGNOSIS — K219 Gastro-esophageal reflux disease without esophagitis: Secondary | ICD-10-CM | POA: Insufficient documentation

## 2015-07-04 DIAGNOSIS — Z96659 Presence of unspecified artificial knee joint: Secondary | ICD-10-CM | POA: Insufficient documentation

## 2016-08-29 IMAGING — NM NM BONE WHOLE BODY
2 series · 2 of 2 positions shown · non-contrast
Comparison: None.

CLINICAL DATA: Right ribcage pain for the past 5 months with no
history of trauma or fracture; history of cutaneous melanoma.

EXAM:
NUCLEAR MEDICINE WHOLE BODY BONE SCAN
TECHNIQUE: Whole body anterior and posterior images were obtained approximately
3 hours after intravenous injection of radiopharmaceutical.
RADIOPHARMACEUTICALS:  26.3 mCi Aechnetium-22m MDP IV

[Series 1: whole body · 2.66mm/px · 1 of 1 slices shown (1 of 2)]
[im 1/1]
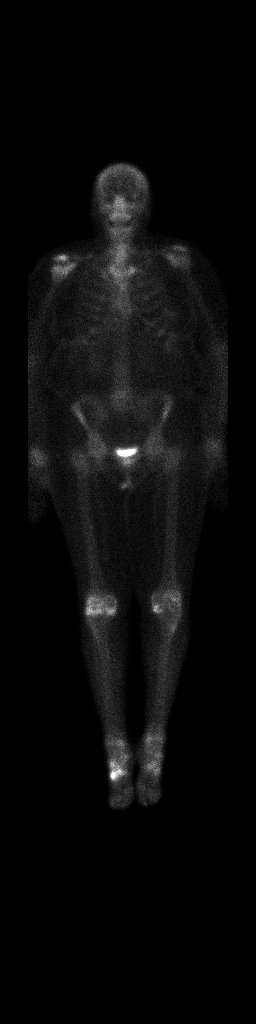

[Series 1: whole body · 2.66mm/px · 1 of 1 slices shown (2 of 2)]
[im 1/1]
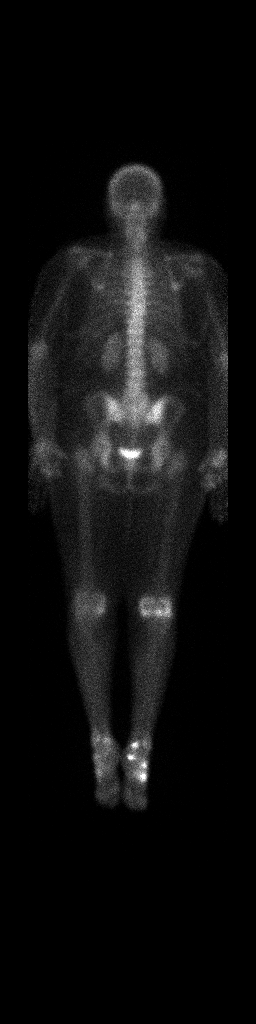

[2 of 2 positions shown; findings below may reference images not displayed]

FINDINGS: There is adequate uptake of the radiopharmaceutical by the skeleton.
Adequate soft tissue clearance and renal activity is observed.

Uptake over the ribs is normal. There is minimal increased uptake
associated with the second Marieli Pokorny junction that is likely
degenerative. Minimal uptake elsewhere in the costo chondral
junctions anteriorly is noted bilaterally. Uptake over the calvarium
and spine is within the limits of normal. No abnormal uptake is
demonstrated in the observed portions of the pectoral girdle. The
pelvis and hips are unremarkable. There is degenerative like uptake
associated with the knees and ankles.
IMPRESSION: There are no bone scan findings to suggest metastatic disease to the
skeleton. There are areas of abnormal increased uptake as described
most compatible with degenerative change with the right ankle and
hindfoot being most significantly affected.

Plain films of the right ankle and foot are recommended.

## 2020-10-23 DIAGNOSIS — H9313 Tinnitus, bilateral: Secondary | ICD-10-CM | POA: Insufficient documentation

## 2020-10-23 DIAGNOSIS — H608X3 Other otitis externa, bilateral: Secondary | ICD-10-CM | POA: Insufficient documentation

## 2020-10-23 DIAGNOSIS — J310 Chronic rhinitis: Secondary | ICD-10-CM | POA: Insufficient documentation

## 2021-04-18 DIAGNOSIS — R053 Chronic cough: Secondary | ICD-10-CM | POA: Insufficient documentation

## 2021-06-07 DIAGNOSIS — R0609 Other forms of dyspnea: Secondary | ICD-10-CM | POA: Insufficient documentation

## 2022-02-05 DIAGNOSIS — M6702 Short Achilles tendon (acquired), left ankle: Secondary | ICD-10-CM | POA: Insufficient documentation

## 2022-02-05 DIAGNOSIS — M7730 Calcaneal spur, unspecified foot: Secondary | ICD-10-CM | POA: Insufficient documentation

## 2022-04-16 DIAGNOSIS — M6788 Other specified disorders of synovium and tendon, other site: Secondary | ICD-10-CM | POA: Insufficient documentation

## 2022-04-29 DIAGNOSIS — M1990 Unspecified osteoarthritis, unspecified site: Secondary | ICD-10-CM | POA: Insufficient documentation

## 2022-04-29 DIAGNOSIS — E669 Obesity, unspecified: Secondary | ICD-10-CM | POA: Insufficient documentation

## 2022-04-29 DIAGNOSIS — I10 Essential (primary) hypertension: Secondary | ICD-10-CM | POA: Insufficient documentation

## 2022-04-29 DIAGNOSIS — C4359 Malignant melanoma of other part of trunk: Secondary | ICD-10-CM | POA: Insufficient documentation

## 2022-10-09 ENCOUNTER — Encounter: Payer: Self-pay | Admitting: Internal Medicine

## 2022-11-18 ENCOUNTER — Encounter: Payer: Self-pay | Admitting: Internal Medicine

## 2022-11-18 ENCOUNTER — Ambulatory Visit: Payer: Medicare Other | Attending: Internal Medicine | Admitting: Internal Medicine

## 2022-11-18 VITALS — BP 118/70 | HR 78 | Ht 63.0 in | Wt 172.0 lb

## 2022-11-18 DIAGNOSIS — Z136 Encounter for screening for cardiovascular disorders: Secondary | ICD-10-CM | POA: Insufficient documentation

## 2022-11-18 DIAGNOSIS — E785 Hyperlipidemia, unspecified: Secondary | ICD-10-CM | POA: Insufficient documentation

## 2022-11-18 DIAGNOSIS — I700 Atherosclerosis of aorta: Secondary | ICD-10-CM | POA: Diagnosis present

## 2022-11-18 DIAGNOSIS — E7849 Other hyperlipidemia: Secondary | ICD-10-CM | POA: Insufficient documentation

## 2022-11-18 NOTE — Patient Instructions (Signed)
Medication Instructions:  Your physician recommends that you continue on your current medications as directed. Please refer to the Current Medication list given to you today.   Labwork: None  Testing/Procedures: Your physician has requested that you have an abdominal aorta duplex. During this test, an ultrasound is used to evaluate the aorta. Allow 30 minutes for this exam. Do not eat after midnight the day before and avoid carbonated beverages   Follow-Up: Your physician recommends that you schedule a follow-up appointment in: Pending Results  Any Other Special Instructions Will Be Listed Below (If Applicable).  If you need a refill on your cardiac medications before your next appointment, please call your pharmacy.

## 2022-11-18 NOTE — Progress Notes (Signed)
Cardiology Office Note  Date: 11/18/2022   ID: Courtney Harper, DOB Nov 27, 1945, MRN [id number]  PCP:  Ardyth Man, MD  Cardiologist:  Marjo Bicker, MD Electrophysiologist:  None   History of Present Illness: Courtney Harper is a 77 y.o. 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  Overall doing great, never had any ischemia evaluation before.  No symptoms of angina, DOE, orthopnea, PND, leg swelling.  No palpitations, dizziness, presyncope and syncope.   Past Medical History:  Diagnosis Date   Arthritis    Fatty liver    GERD (gastroesophageal reflux disease)    Hypercholesteremia    Melanoma (HCC)     Past Surgical History:  Procedure Laterality Date   ABDOMINAL HYSTERECTOMY  1987   APPENDECTOMY  1987   CHOLECYSTECTOMY  01/2013   DIAPHRAGMATIC HERNIA REPAIR      Current Outpatient Medications  Medication Sig Dispense Refill   atorvastatin (LIPITOR) 20 MG tablet Take 20 mg by mouth daily.     gabapentin (NEURONTIN) 100 MG capsule Take 100 mg by mouth 3 (three) times daily.     No current facility-administered medications for this visit.   Allergies:  Patient has no known allergies.   Social History: The patient  reports that she has never smoked. She has never used smokeless tobacco. She reports that she does not drink alcohol and does not use drugs.   Family History: The patient's family history includes Diabetes in her father; Heart disease in her mother; Kidney disease in her father; Rectal cancer in her maternal uncle.   ROS:  Please see the history of present illness. Otherwise, complete review of systems is positive for none  All other systems are reviewed and negative.   Physical Exam: VS:  BP 118/70   Pulse 78   Ht 5\' 3"  (1.6 m)   Wt 172 lb (78 kg)   SpO2 97%   BMI 30.47 kg/m , BMI Body mass index is 30.47 kg/m.  Wt  Readings from Last 3 Encounters:  11/18/22 172 lb (78 kg)  08/16/14 189 lb 3.2 oz (85.8 kg)  07/18/14 191 lb (86.6 kg)    General: Patient appears comfortable at rest. HEENT: Conjunctiva and lids normal, oropharynx clear with moist mucosa. Neck: Supple, no elevated JVP or carotid bruits, no thyromegaly. Lungs: Clear to auscultation, nonlabored breathing at rest. Cardiac: Regular rate and rhythm, no S3 or significant systolic murmur, no pericardial rub. Abdomen: Soft, nontender, no hepatomegaly, bowel sounds present, no guarding or rebound. Extremities: No pitting edema, distal pulses 2+. Skin: Warm and dry. Musculoskeletal: No kyphosis. Neuropsychiatric: Alert and oriented x3, affect grossly appropriate.  Recent Labwork: No results found for requested labs within last 365 days.  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  HLD, at goal: Reviewed recent lipid panel that showed LDL 110 and normal triglycerides.  Continue atorvastatin 20 mg nightly which she has been since 1991.  Follows up with PCP.      Medication Adjustments/Labs and Tests Ordered: Current medicines are reviewed at length with the patient today.  Concerns regarding medicines are outlined above.    Disposition:  Follow up  pending results  Signed Suzannah Bettes Verne Spurr, MD, 11/18/2022 3:22 PM  [facility] at [facility] [address] [place], [place], [place] [address]

## 2022-12-04 ENCOUNTER — Ambulatory Visit: Payer: Medicare Other | Attending: Internal Medicine

## 2022-12-04 DIAGNOSIS — I700 Atherosclerosis of aorta: Secondary | ICD-10-CM

## 2023-01-12 ENCOUNTER — Other Ambulatory Visit: Payer: Self-pay

## 2023-01-12 DIAGNOSIS — C434 Malignant melanoma of scalp and neck: Secondary | ICD-10-CM | POA: Insufficient documentation

## 2023-01-12 DIAGNOSIS — H539 Unspecified visual disturbance: Secondary | ICD-10-CM

## 2023-01-12 NOTE — Progress Notes (Unsigned)
Patient name: Courtney Harper MRN: [id number] DOB: 06/20/1945 Sex: female  REASON FOR CONSULT: Partial visual field loss, evaluate for TIA  HPI: Courtney Harper is a 77 y.o. female, with history of hyperlipidemia that presents for evaluation of partial visual field loss.  Patient states that she had visual field loss in her left eye that happened on about 3 different occasions in early September.  She states she has had no further episodes although her vision remains blurry at times.  She was evaluated by an ophthalmologist and underwent a dilated eye exam that was otherwise normal.  She was sent to [place] to rule out TIA versus ocular migraine.  Reports no other focal neurologic events.  She has no history of prior TIA or stroke.  Past Medical History:  Diagnosis Date   Arthritis    Fatty liver    GERD (gastroesophageal reflux disease)    Hypercholesteremia    Melanoma (HCC)     Past Surgical History:  Procedure Laterality Date   ABDOMINAL HYSTERECTOMY  1987   APPENDECTOMY  1987   CHOLECYSTECTOMY  01/2013   DIAPHRAGMATIC HERNIA REPAIR      Family History  Problem Relation Age of Onset   Rectal cancer Maternal Uncle        age 8's dx   Colon polyps Neg Hx    Diabetes Father    Kidney disease Father    Gallbladder disease Neg Hx    Esophageal cancer Neg Hx    Heart disease Mother     SOCIAL HISTORY: Social History   Socioeconomic History   Marital status: Married    Spouse name: Not on file   Number of children: 1   Years of education: Not on file   Highest education level: Not on file  Occupational History   Occupation: Retired  Tobacco Use   Smoking status: Never   Smokeless tobacco: Never  Substance and Sexual Activity   Alcohol use: No    Alcohol/week: 0.0 standard drinks of alcohol   Drug use: No   Sexual activity: Not on file  Other Topics Concern   Not on file  Social History Narrative   Not on file   Social Determinants of Health    Financial Resource Strain: Not on file  Food Insecurity: Not on file  Transportation Needs: Not on file  Physical Activity: Not on file  Stress: Not on file  Social Connections: Not on file  Intimate Partner Violence: Not on file    Allergies  Allergen Reactions   Bee Venom Rash and Swelling    Current Outpatient Medications  Medication Sig Dispense Refill   atorvastatin (LIPITOR) 20 MG tablet Take 20 mg by mouth daily.     gabapentin (NEURONTIN) 100 MG capsule Take 100 mg by mouth 3 (three) times daily.     No current facility-administered medications for this visit.    REVIEW OF SYSTEMS:  [X]  denotes positive finding, [ ]  denotes negative finding Cardiac  Comments:  Chest pain or chest pressure:    Shortness of breath upon exertion:    Short of breath when lying flat:    Irregular heart rhythm:        Vascular    Pain in calf, thigh, or hip brought on by ambulation:    Pain in feet at night that wakes you up from your sleep:     Blood clot in your veins:    Leg swelling:  Pulmonary    Oxygen at home:    Productive cough:     Wheezing:         Neurologic    Sudden weakness in arms or legs:     Sudden numbness in arms or legs:     Sudden onset of difficulty speaking or slurred speech:    Temporary loss of vision in one eye:     Problems with dizziness:         Gastrointestinal    Blood in stool:     Vomited blood:         Genitourinary    Burning when urinating:     Blood in urine:        Psychiatric    Major depression:         Hematologic    Bleeding problems:    Problems with blood clotting too easily:        Skin    Rashes or ulcers:        Constitutional    Fever or chills:      PHYSICAL EXAM: There were no vitals filed for this visit.  GENERAL: The patient is a well-nourished female, in no acute distress. The vital signs are documented above. CARDIAC: There is a regular rate and rhythm.  VASCULAR:  No prior neck  incisions PULMONARY: No respiratory distress. ABDOMEN: Soft and non-tender. MUSCULOSKELETAL: There are no major deformities or cyanosis. NEUROLOGIC: No focal weakness or paresthesias are detected.  Cranial nerves II through XII grossly intact. SKIN: There are no ulcers or rashes noted. PSYCHIATRIC: The patient has a normal affect.  DATA:   Carotid duplex today shows 1 to 39% stenosis bilaterally  Assessment/Plan:  77 y.o. female, with history of hyperlipidemia that presents for evaluation of partial visual field loss and further evaluation of possible TIA.  She had 3 episodes in early September with partial vision loss in the left eye that was brief.  This has since resolved with no further episodes over the last several months.  Her symptoms could certainly be consistent with a TIA.  However, her carotid duplex today shows minimal 1 to 39% stenosis in bilateral carotids.  She also had dilated ocular exam that showed normal ocular health and I would have expected stroke or TIA to have some abnormal finding on her eye exam.  With minimal carotid disease bilaterally there is no role for surgical intervention.  I discussed 81 mg aspirin for risk reduction.  She is already on statin therapy.  I will see her in 2 years with repeat carotid duplex.  I discussed the current guidelines are surgical intervention for greater than 50% carotid stenosis if felt her carotid disease is symptomatic and again she does not meet criteria. Will pursue surveillance   Cephus Shelling, MD [facility] Office: [phone]

## 2023-01-13 ENCOUNTER — Encounter: Payer: Self-pay | Admitting: Vascular Surgery

## 2023-01-13 ENCOUNTER — Ambulatory Visit (INDEPENDENT_AMBULATORY_CARE_PROVIDER_SITE_OTHER): Payer: Medicare Other

## 2023-01-13 ENCOUNTER — Ambulatory Visit (INDEPENDENT_AMBULATORY_CARE_PROVIDER_SITE_OTHER): Payer: Medicare Other | Admitting: Vascular Surgery

## 2023-01-13 VITALS — BP 149/84 | HR 79 | Temp 98.1°F | Ht 63.0 in | Wt 159.0 lb

## 2023-01-13 DIAGNOSIS — I6523 Occlusion and stenosis of bilateral carotid arteries: Secondary | ICD-10-CM | POA: Diagnosis not present

## 2023-01-13 DIAGNOSIS — H539 Unspecified visual disturbance: Secondary | ICD-10-CM | POA: Diagnosis not present

## 2023-01-13 DIAGNOSIS — I779 Disorder of arteries and arterioles, unspecified: Secondary | ICD-10-CM | POA: Insufficient documentation

## 2023-02-11 ENCOUNTER — Encounter: Payer: Self-pay | Admitting: Neurology

## 2023-02-25 NOTE — Progress Notes (Signed)
Initial neurology clinic note  Courtney Harper MRN: [id number] DOB: [birth year]  Referring provider: Ardyth Man, MD  Primary care provider: Ardyth Man, MD  Reason for consult:  blurry vision, vision loss  Subjective:  This is Courtney Harper, a 77 y.o. right-handed female with a medical history of HLD, OA, GERD, melanoma, fatty liver, pituitary problems in 1970s who presents to neurology clinic with blurry vision, vision loss. The patient is accompanied by husband.  Patient took gabapentin given by ENT for chronic cough for 14 months. She stopped in 11/2022. On 12/10/22 patient was watching TV and noticed acute onset right sided vision loss. It was present when covering either eye. It lasted a couple of hours then resolved. She had no further neurologic symptoms.  2 days later she started having hot flashes. She then started having very blurry vision. She cannot read well or see the TV. This does not come and go. She also endorses lightheadedness and blurry vision. She denies pain or headache. She is not a headache person. She will have pressure on the medial aspect of both eyes. She also has very watery eyes bilaterally. This will last about 10 minutes. She has chronically had phonophobia, but more recently had photophobia. She denies nausea and vomiting. She has noticed her BP will be high (SBP in 150s) during hot flashes. Per patient, this has not been looked into.  Of note, patient had lost total vision while on interferon for melanoma in 2002. It lasted two days then symptoms resolved. Patient mentions an episode of passing out in 1973 and Harper to have pituitary problem. She was on hormones for a while and never had recurrence.  She was evaluated by ophthalmology were testing was normal. Her visual acuity was not checked per patient. She still has blurry vision even when wearing her glasses, which husband says she does not do often. Patient had concerns about pituitary  and thyroid so prolactin and TSH were checked and were normal.  Of note, patient was seen by Dr. Chestine Spore in vascular surgery on 01/13/23 for partial visual field loss in 11/2022 to rule out TIA. There was no significant carotid disease seen though. Patient has been taking aspirin 81 mg daily since this visit. She has been on Lipitor 20 mg daily since the 1990s.  Patient was given Bernita Raisin (sample) but patient has never taken as she does not have a headache.  She denies ptosis, dysarthria, dysphagia, difficulty swallowing, orthopnea, and no weakness of arms or legs. Her blurry vision does not fluctuate.  She denies fevers, temporal pain, or jaw claudication.  Caffeine use: 2 cups of coffee per day Non-smoker EtOH use: rare glass of wine Restrictive diet: on weight watchers, has lost 35 lbs on purpose the last year Family history of neurologic disease: father had a stroke  MEDICATIONS:  Outpatient Encounter Medications as of 02/27/2023  Medication Sig   aspirin EC (ASPIR-LOW) 81 MG tablet    atorvastatin (LIPITOR) 20 MG tablet Take 20 mg by mouth daily.   [DISCONTINUED] gabapentin (NEURONTIN) 100 MG capsule Take 100 mg by mouth 3 (three) times daily. (Patient not taking: Reported on 02/27/2023)   No facility-administered encounter medications on file as of 02/27/2023.    PAST MEDICAL HISTORY: Past Medical History:  Diagnosis Date   Arthritis    Fatty liver    GERD (gastroesophageal reflux disease)    Hypercholesteremia    Melanoma (HCC)     PAST SURGICAL HISTORY:  Past Surgical History:  Procedure Laterality Date   ABDOMINAL HYSTERECTOMY  1987   APPENDECTOMY  1987   CHOLECYSTECTOMY  01/2013   DIAPHRAGMATIC HERNIA REPAIR      ALLERGIES: Allergies  Allergen Reactions   Bee Venom Rash and Swelling    FAMILY HISTORY: Family History  Problem Relation Age of Onset   Heart disease Mother    Diabetes Father    Kidney disease Father    Rectal cancer Maternal Uncle        age  54's dx   Colon polyps Neg Hx    Gallbladder disease Neg Hx    Esophageal cancer Neg Hx     SOCIAL HISTORY: Social History   Tobacco Use   Smoking status: Never   Smokeless tobacco: Never  Vaping Use   Vaping status: Never Used  Substance Use Topics   Alcohol use: No    Alcohol/week: 0.0 standard drinks of alcohol   Drug use: No   Social History   Social History Narrative   Are you right handed or left handed? Right Handed    Are you currently employed ? No    What is your current occupation? Retired after 28 years   Do you live at home alone? No   Who lives with you? With husband Gerlene Burdock    What type of home do you live in: 1 story or 2 story? Lives in a one story home        Objective:  Vital Signs:  BP (!) 151/74   Pulse 72   Ht 5\' 3"  (1.6 m)   Wt 165 lb (74.8 kg)   SpO2 98%   BMI 29.23 kg/m   General: General appearance: Awake and alert. No distress. Cooperative with exam.  Skin: No obvious rash or jaundice. HEENT: Atraumatic. Anicteric. Lungs: Non-labored breathing on room air  Heart: Regular. No carotid bruits. Extremities: No edema. Psych: Affect appropriate.  Neurological: Mental Status: Alert. Speech fluent. No pseudobulbar affect Cranial Nerves: CNII: No RAPD. Visual fields intact. CNIII, IV, VI: PERRL. No nystagmus. EOMI. Difficulty sustaining up gaze. CN V: Facial sensation intact bilaterally to fine touch CN VII: Orbicularis oculi mildly weak. Orbicularis oris strong. No ptosis at rest or after sustained upgaze. CN VIII: Hears finger rub well bilaterally. CN IX: No hypophonia. CN X: Palate elevates symmetrically. CN XI: Full strength shoulder shrug bilaterally. CN XII: Tongue protrusion full and midline. No atrophy or fasciculations. No significant dysarthria Motor: Tone: paratonia in all extremities. No fasciculations in extremities. No atrophy.  Individual muscle group testing (MRC grade out of 5):  Movement     Neck flexion 5     Neck extension 5     Right Left   Shoulder abduction 5- 5- ?fatigable  Elbow flexion 5 5   Elbow extension 5 5   Finger abduction - FDI 5 5   Finger abduction - ADM 5 5   Finger extension 5 5   Finger distal flexion - 2/3 5 5    Finger distal flexion - 4/5 5 5    Thumb flexion - FPL 5 5   Thumb abduction - APB 5- 5- fatigable   Hip flexion 4+ 4+   Hip extension 5 5   Hip adduction 5 5   Hip abduction 5 5   Knee extension 5 5   Knee flexion 5 5   Dorsiflexion 5 5   Plantarflexion 5 5     Reflexes:  Right Left   Bicep 2+ 2+  Tricep 2+ 2+   BrRad 2+ 2+   Knee 2+ 2+   Ankle Trace Trace    Sensation: Pinprick intact in all extremities Coordination: Intact finger-to- nose-finger bilaterally. Romberg negative. Finger tapping normal. Gait: Able to rise from chair with arms crossed unassisted. Normal, narrow-based gait..   Labs and Imaging review: External labs: TSH (09/25/22): wnl Prolactin (09/25/22): wnl HbA1c (10/21/21): 5.4 CMP (10/21/21): alk phos mildly elevated to 177, otherwise unremarkable CBC (10/21/21): unremarkable Lipid panel (10/21/21): tChol 168, LDL 110, TG 66  Imaging: Carotid ultrasound (01/13/23): Summary:  Right Carotid: There is no evidence of stenosis in the right ICA.   Left Carotid: Velocities in the left ICA are consistent with a 1-39%  stenosis.   Vertebrals: Bilateral vertebral arteries demonstrate antegrade flow.  Subclavians: Normal flow hemodynamics were seen in bilateral subclavian               arteries.   Assessment/Plan:  Lisette Vandenbroek is a 77 y.o. female who presents for evaluation of episode of right sided vision loss lasting 2 hours and more recent, constant blurry vision, hot flashes, lightheadedness. 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 Her neurological examination is pertinent for eye closure weakness, proximal muscle weakness of legs, and possible fatigable  weakness in the arms. Available diagnostic data is significant for recent TSH and prolactin being normal. Normal Carotid ultrasound. Patient's initial episode of right sided vision loss that lasted 2 hours sounds consistent with right homonomous hemianopia and concerning for TIA or stroke. The more recent blurry vision is less clear. This could be as benign as needed new glasses, but given possible fatigable weakness seen on exam, I also consider myasthenia gravis as possible. How the hot flashes and lightheadedness would be related to MG would be less clear. A intracranial pathology is also possible, so MRI is warranted for multiple reasons. I would like her to continue asa 81 mg and lipitor 20 mg daily for now, treating the initial episode as a TIA.   PLAN: -Blood work: B1, B12, AChR abs -Patient will get me most recent lipid panel. May need to adjust lipitor. -MRI brain and orbits w/wo contrast  -Continue aspirin 81 mg daily -Continue Lipitor 20 mg daily for now   -Return to clinic in 3 months  The impression above as well as the plan as outlined below were extensively discussed with the patient (in the company of husband) who voiced understanding. All questions were answered to their satisfaction.  When available, results of the above investigations and possible further recommendations will be communicated to the patient via telephone/MyChart. Patient to call office if not contacted after expected testing turnaround time.   Total time spent reviewing records, interview, history/exam, documentation, and coordination of care on day of encounter:  60 min   Thank you for allowing me to participate in patient's care.  If I can answer any additional questions, I would be pleased to do so.  Jacquelyne Balint, MD   CC: Park, Nettie Elm, MD [address] [place] [address]  CC: Referring provider: Ardyth Man, MD [address] [place],  [place] [address]

## 2023-02-27 ENCOUNTER — Ambulatory Visit (INDEPENDENT_AMBULATORY_CARE_PROVIDER_SITE_OTHER): Payer: Medicare Other | Admitting: Neurology

## 2023-02-27 ENCOUNTER — Other Ambulatory Visit: Payer: Medicare Other

## 2023-02-27 ENCOUNTER — Encounter: Payer: Self-pay | Admitting: Neurology

## 2023-02-27 VITALS — BP 151/74 | HR 72 | Ht 63.0 in | Wt 165.0 lb

## 2023-02-27 DIAGNOSIS — G459 Transient cerebral ischemic attack, unspecified: Secondary | ICD-10-CM | POA: Diagnosis not present

## 2023-02-27 DIAGNOSIS — H53461 Homonymous bilateral field defects, right side: Secondary | ICD-10-CM

## 2023-02-27 DIAGNOSIS — H538 Other visual disturbances: Secondary | ICD-10-CM

## 2023-02-27 NOTE — Patient Instructions (Addendum)
I saw you today for vision changes (right vision loss and now blurry vision). I am not sure the cause of your symptoms, so I need to do further investigations.  I would like to get blood work today. I also want to get an MRI of your brain and your orbits (eyes). I will be in touch when I have those results to discuss next steps.  Continue aspirin 81 mg and lipitor 20 mg daily. I am concerned the initial vision loss on the right was a stroke or TIA. We will know if it was a stroke on MRI. I want to treat those symptoms as if they were though.  If you have new or worsening symptoms let me know.  I will see you back in clinic in 3 months to re-examine you.  The physicians and staff at [facility] are committed to providing excellent care. You may receive a survey requesting feedback about your experience at our office. We strive to receive "very good" responses to the survey questions. If you feel that your experience would prevent you from giving the office a "very good " response, please contact our office to try to remedy the situation. We may be reached at [phone]. Thank you for taking the time out of your busy day to complete the survey.  Jacquelyne Balint, MD [facility]

## 2023-03-02 ENCOUNTER — Other Ambulatory Visit: Payer: Self-pay

## 2023-03-02 DIAGNOSIS — H53461 Homonymous bilateral field defects, right side: Secondary | ICD-10-CM

## 2023-03-02 DIAGNOSIS — G459 Transient cerebral ischemic attack, unspecified: Secondary | ICD-10-CM

## 2023-03-02 DIAGNOSIS — H538 Other visual disturbances: Secondary | ICD-10-CM

## 2023-03-05 ENCOUNTER — Telehealth: Payer: Self-pay

## 2023-03-05 NOTE — Telephone Encounter (Signed)
Called patient to remind her of Labs she is getting it done in [place] at [facility] .

## 2023-03-05 NOTE — Telephone Encounter (Signed)
-----   Message from Antony Madura sent at 03/02/2023 10:53 AM EST ----- Regarding: Lipid panel order Could you order a lipid panel for this patient? I sent her a message already letting her know I was going to order it.  Thank you,  Riki Rusk

## 2023-03-06 ENCOUNTER — Other Ambulatory Visit (HOSPITAL_COMMUNITY)
Admission: RE | Admit: 2023-03-06 | Discharge: 2023-03-06 | Disposition: A | Discharge status: Home / Self Care | Payer: Medicare Other | Source: Ambulatory Visit | Attending: Neurology | Admitting: Neurology

## 2023-03-06 ENCOUNTER — Encounter: Payer: Self-pay | Admitting: Neurology

## 2023-03-06 ENCOUNTER — Other Ambulatory Visit: Payer: Self-pay | Admitting: Neurology

## 2023-03-06 DIAGNOSIS — H53461 Homonymous bilateral field defects, right side: Secondary | ICD-10-CM | POA: Diagnosis not present

## 2023-03-06 DIAGNOSIS — G459 Transient cerebral ischemic attack, unspecified: Secondary | ICD-10-CM | POA: Diagnosis not present

## 2023-03-06 DIAGNOSIS — H538 Other visual disturbances: Secondary | ICD-10-CM | POA: Diagnosis present

## 2023-03-06 LAB — LIPID PANEL
Cholesterol: 173 mg/dL (ref 0–200)
HDL: 48 mg/dL (ref >40)
LDL Cholesterol: 118 mg/dL — ABNORMAL HIGH (ref 0–99)
Total CHOL/HDL Ratio: 3.6 {ratio}
Triglycerides: 33 mg/dL (ref <150)
VLDL: 7 mg/dL (ref 0–40)

## 2023-03-06 MED ORDER — ATORVASTATIN CALCIUM 40 MG PO TABS: 40.0000 mg | ORAL_TABLET | Freq: Every day | ORAL | 3 refills | Status: DC

## 2023-03-09 LAB — VITAMIN B12: Vitamin B-12: 404 pg/mL (ref 200–1100)

## 2023-03-09 LAB — MUSK ANTIBODY TEST
Interpretation: NEGATIVE
Technical Results: <1:10 {titer}

## 2023-03-09 LAB — MYASTHENIA GRAVIS PANEL 2 W/ RFLX MUSK ANTIBODY
A CHR BINDING ABS: <0.3 nmol/L
ACHR Blocking Abs: <15 %{inhibition} (ref <15)
Acetylchol Modul Ab: 25 %{inhibition}

## 2023-03-09 LAB — VITAMIN B1: Vitamin B1 (Thiamine): 13 nmol/L (ref 8–30)

## 2023-03-20 ENCOUNTER — Ambulatory Visit
Admission: RE | Admit: 2023-03-20 | Discharge: 2023-03-20 | Disposition: A | Discharge status: Home / Self Care | Payer: Medicare Other | Source: Ambulatory Visit | Attending: Neurology

## 2023-03-20 DIAGNOSIS — G459 Transient cerebral ischemic attack, unspecified: Secondary | ICD-10-CM

## 2023-03-20 DIAGNOSIS — H538 Other visual disturbances: Secondary | ICD-10-CM

## 2023-03-20 DIAGNOSIS — H53461 Homonymous bilateral field defects, right side: Secondary | ICD-10-CM

## 2023-03-20 MED ORDER — GADOPICLENOL 0.5 MMOL/ML IV SOLN
7.0000 mL | Freq: Once | INTRAVENOUS | Status: AC | PRN
  Administered 2023-03-20: 7 mL via INTRAVENOUS

## 2023-03-23 ENCOUNTER — Telehealth: Payer: Self-pay | Admitting: Neurology

## 2023-03-23 NOTE — Telephone Encounter (Signed)
 Courtney Harper with [facility] radiology needs a call back from a nurse or doctor regarding MRI report on 03/20/2023

## 2023-03-23 NOTE — Telephone Encounter (Signed)
 Called to discuss results of MRI brain and orbits. Patient unable to talk now. Will call back later.  Jacquelyne Balint, MD [facility]

## 2023-03-23 NOTE — Telephone Encounter (Signed)
 Called patient to discuss the results of her MRI brain and orbits. 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  She had an episode 03/10/23 where she had flashing lights and her BP was 200s. She had some pressure between her eyes. She went to the ED, but has had no other problems with BP. She still has blurry vision that is constant and hot flashes. Her BP has otherwise not ever been higher than 140 during the hot flashes. She denies pain.  She is seeing ENT for ear problems, but it is hard to imagine how this would be causing symptoms.  I explained that I did not have a good explanation for her symptoms currently, but she will monitor her symptoms and keep me up to date. I will see her back in clinic in a few months as well.  All questions were answered.  Venetia Potters, MD [facility]

## 2023-03-24 ENCOUNTER — Encounter: Payer: Self-pay | Admitting: Neurology

## 2023-03-26 ENCOUNTER — Ambulatory Visit: Payer: Medicare Other | Admitting: Neurology

## 2023-06-03 ENCOUNTER — Encounter: Payer: Self-pay | Admitting: Neurology

## 2023-06-25 ENCOUNTER — Other Ambulatory Visit (HOSPITAL_COMMUNITY)
Admission: RE | Admit: 2023-06-25 | Discharge: 2023-06-25 | Disposition: A | Discharge status: Home / Self Care | Source: Ambulatory Visit

## 2023-06-25 DIAGNOSIS — I158 Other secondary hypertension: Secondary | ICD-10-CM | POA: Insufficient documentation

## 2023-06-25 DIAGNOSIS — R748 Abnormal levels of other serum enzymes: Secondary | ICD-10-CM | POA: Insufficient documentation

## 2023-06-25 DIAGNOSIS — R232 Flushing: Secondary | ICD-10-CM | POA: Insufficient documentation

## 2023-06-25 DIAGNOSIS — R42 Dizziness and giddiness: Secondary | ICD-10-CM | POA: Diagnosis not present

## 2023-06-25 LAB — CBC WITH DIFFERENTIAL/PLATELET
Abs Immature Granulocytes: 0.01 10*3/uL (ref 0.00–0.07)
Basophils Absolute: 0 10*3/uL (ref 0.0–0.1)
Basophils Relative: 1 %
Eosinophils Absolute: 0.2 10*3/uL (ref 0.0–0.5)
Eosinophils Relative: 4 %
HCT: 42.5 % (ref 36.0–46.0)
Hemoglobin: 14 g/dL (ref 12.0–15.0)
Immature Granulocytes: 0 %
Lymphocytes Relative: 33 %
Lymphs Abs: 1.6 10*3/uL (ref 0.7–4.0)
MCH: 29.2 pg (ref 26.0–34.0)
MCHC: 32.9 g/dL (ref 30.0–36.0)
MCV: 88.7 fL (ref 80.0–100.0)
Monocytes Absolute: 0.3 10*3/uL (ref 0.1–1.0)
Monocytes Relative: 7 %
Neutro Abs: 2.8 10*3/uL (ref 1.7–7.7)
Neutrophils Relative %: 55 %
Platelets: 186 10*3/uL (ref 150–400)
RBC: 4.79 MIL/uL (ref 3.87–5.11)
RDW: 12.7 % (ref 11.5–15.5)
WBC: 4.9 10*3/uL (ref 4.0–10.5)
nRBC: 0 % (ref 0.0–0.2)

## 2023-06-25 LAB — COMPREHENSIVE METABOLIC PANEL WITH GFR
ALT: 17 U/L (ref 0–44)
AST: 22 U/L (ref 15–41)
Albumin: 3.9 g/dL (ref 3.5–5.0)
Alkaline Phosphatase: 152 U/L — ABNORMAL HIGH (ref 38–126)
Anion gap: 5 (ref 5–15)
BUN: 22 mg/dL (ref 8–23)
CO2: 26 mmol/L (ref 22–32)
Calcium: 9.3 mg/dL (ref 8.9–10.3)
Chloride: 107 mmol/L (ref 98–111)
Creatinine, Ser: 0.71 mg/dL (ref 0.44–1.00)
GFR, Estimated: >60 mL/min (ref >60)
Glucose, Bld: 100 mg/dL — ABNORMAL HIGH (ref 70–99)
Potassium: 4.3 mmol/L (ref 3.5–5.1)
Sodium: 138 mmol/L (ref 135–145)
Total Bilirubin: 0.6 mg/dL (ref 0.0–1.2)
Total Protein: 7.3 g/dL (ref 6.5–8.1)

## 2023-06-25 LAB — GAMMA GT: GGT: 14 U/L (ref 7–50)

## 2023-06-25 LAB — CORTISOL: Cortisol, Plasma: 10.2 ug/dL

## 2023-06-26 LAB — LUTEINIZING HORMONE: LH: 27.1 m[IU]/mL (ref 7.7–58.5)

## 2023-06-26 LAB — PROLACTIN: Prolactin: 5.8 ng/mL (ref 3.6–25.2)

## 2023-06-26 LAB — FOLLICLE STIMULATING HORMONE: FSH: 43.9 m[IU]/mL (ref 25.8–134.8)

## 2023-06-27 LAB — GROWTH HORMONE: Growth Hormone: 1.9 ng/mL (ref 0.0–10.0)

## 2023-06-27 LAB — INSULIN-LIKE GROWTH FACTOR: Somatomedin C: 117 ng/mL (ref 42–185)

## 2023-07-01 ENCOUNTER — Other Ambulatory Visit (HOSPITAL_COMMUNITY)
Admission: RE | Admit: 2023-07-01 | Discharge: 2023-07-01 | Disposition: A | Discharge status: Home / Self Care | Source: Ambulatory Visit

## 2023-07-01 DIAGNOSIS — R42 Dizziness and giddiness: Secondary | ICD-10-CM | POA: Diagnosis not present

## 2023-07-01 DIAGNOSIS — I158 Other secondary hypertension: Secondary | ICD-10-CM | POA: Diagnosis present

## 2023-07-01 DIAGNOSIS — R232 Flushing: Secondary | ICD-10-CM | POA: Diagnosis not present

## 2023-07-02 LAB — METANEPHRINES, PLASMA
Metanephrine, Free: <25 pg/mL (ref 0.0–88.0)
Normetanephrine, Free: 42.2 pg/mL (ref 0.0–285.2)

## 2023-07-03 NOTE — Progress Notes (Deleted)
 NEUROLOGY FOLLOW UP OFFICE NOTE  Courtney Harper [id number]  Subjective:  Courtney Harper is a 78 y.o. 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  To briefly review: 02/27/23 Patient took gabapentin given by ENT for chronic cough for 14 months. She stopped in 11/2022. On 12/10/22 patient was watching TV and noticed acute onset right sided vision loss. It was present when covering either eye. It lasted a couple of hours then resolved. She had no further neurologic symptoms.   2 days later she started having hot flashes. She then started having very blurry vision. She cannot read well or see the TV. This does not come and go. She also endorses lightheadedness and blurry vision. She denies pain or headache. She is not a headache person. She will have pressure on the medial aspect of both eyes. She also has very watery eyes bilaterally. This will last about 10 minutes. She has chronically had phonophobia, but more recently had photophobia. She denies nausea and vomiting. She has noticed her BP will be high (SBP in 150s) during hot flashes. Per patient, this has not been looked into.   Of note, patient had lost total vision while on interferon for melanoma in 2002. It lasted two days then symptoms resolved. Patient mentions an episode of passing out in 1973 and found to have pituitary problem. She was on hormones for a while and never had recurrence.   She was evaluated by ophthalmology were testing was normal. Her visual acuity was not checked per patient. She still has blurry vision even when wearing her glasses, which husband says she does not do often. Patient had concerns about pituitary and thyroid so prolactin and TSH were checked and were normal.   Of note, patient was seen by Dr. Fulton Job in vascular surgery on 01/13/23 for partial visual field  loss in 11/2022 to rule out TIA. There was no significant carotid disease seen though. Patient has been taking aspirin 81 mg daily since this visit. She has been on Lipitor 20 mg daily since the 1990s.   Patient was given Florette Hurry (sample) but patient has never taken as she does not have a headache.   She denies ptosis, dysarthria, dysphagia, difficulty swallowing, orthopnea, and no weakness of arms or legs. Her blurry vision does not fluctuate.   She denies fevers, temporal pain, or jaw claudication.   Caffeine use: 2 cups of coffee per day Non-smoker EtOH use: rare glass of wine Restrictive diet: on weight watchers, has lost 35 lbs on purpose the last year Family history of neurologic disease: father had a stroke  Most recent Assessment and Plan (02/27/23): Courtney Harper is a 78 y.o. female who presents for evaluation of episode of right sided vision loss lasting 2 hours and more recent, constant blurry vision, hot flashes, lightheadedness. 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 Her neurological examination is pertinent for eye closure weakness, proximal muscle weakness of legs, and possible fatigable weakness in the arms. Available diagnostic data is significant for recent TSH and prolactin being normal. Normal Carotid ultrasound. Patient's initial episode of right sided vision loss that lasted 2 hours sounds consistent with right homonomous hemianopia and concerning for TIA or stroke. The more recent blurry vision is less clear. This could be as benign  as needed new glasses, but given possible fatigable weakness seen on exam, I also consider myasthenia gravis as possible. How the hot flashes and lightheadedness would be related to MG would be less clear. A intracranial pathology is also possible, so MRI is warranted for multiple reasons. I would like her to continue asa 81 mg and lipitor 20 mg daily for now, treating the  initial episode as a TIA.    PLAN: -Blood work: B1, B12, AChR abs -Patient will get me most recent lipid panel. May need to adjust lipitor. -MRI brain and orbits w/wo contrast  -Continue aspirin 81 mg daily -Continue Lipitor 20 mg daily for now  Since their last visit: Patient had not had a recent lipid panel, so this was added to labs. LDL was 118. I recommended increasing lipitor to 40 mg daily. Other labs, including MG panel were normal.  Per my phone note from 03/23/23: Called patient to discuss the results of her MRI brain and orbits. 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   She had an episode 03/10/23 where she had flashing lights and her BP was 200s. She had some pressure between her eyes. She went to the ED, but has had no other problems with BP. She still has blurry vision that is constant and hot flashes. Her BP has otherwise not ever been higher than 140 during the hot flashes. She denies pain.   She is seeing ENT for ear problems, but it is hard to imagine how this would be causing symptoms.   I explained that I did not have a good explanation for her symptoms currently, but she will monitor her symptoms and keep me up to date. I will see her back in clinic in a few months as well.  She sent me the following message on 03/24/23:  I woke up to what felt like a stiff neck at 3am  Both sides of my neck were very stiff & sore,  pain radiated to the back of my head, along with an increase in blurry vision when I was laying down I got up, sat in a recliner, took a Tylenol and pain and blurry vision cleared in about 1 hour.  I did rest in the recliner for approximately 2 hours and had no other symptoms Presently no stiffness in the neck and honestly feel fine,  taking it slow and moving around.  I feel my best when I am standing & moving around.  This never happened before.  I did have pressure tests done in my ears  yesterday during the hearing test thought I mention that.  My ENT Dr Courtney Harper is going to review the MRI and has set up appointment for 04/07/23 to decide what the treatment for the eustachisn tube dysfunction will be   ***  MEDICATIONS:  Outpatient Encounter Medications as of 07/16/2023  Medication Sig   aspirin EC (ASPIR-LOW) 81 MG tablet    atorvastatin  (LIPITOR) 40 MG tablet Take 1 tablet (40 mg total) by mouth daily.   No facility-administered encounter medications on file as of 07/16/2023.    PAST MEDICAL HISTORY: Past Medical History:  Diagnosis Date   Arthritis    Fatty liver    GERD (gastroesophageal reflux disease)    Hypercholesteremia    Melanoma (HCC)     PAST SURGICAL HISTORY: Past Surgical History:  Procedure Laterality Date   ABDOMINAL HYSTERECTOMY  1987   APPENDECTOMY  1987   CHOLECYSTECTOMY  01/2013   DIAPHRAGMATIC HERNIA REPAIR      ALLERGIES: Allergies  Allergen Reactions   Bee Venom Rash and Swelling    FAMILY HISTORY: Family History  Problem Relation Age of Onset   Heart disease Mother    Diabetes Father    Kidney disease Father    Rectal cancer Maternal Uncle        age 1's dx   Colon polyps Neg Hx    Gallbladder disease Neg Hx    Esophageal cancer Neg Hx     SOCIAL HISTORY: Social History   Tobacco Use   Smoking status: Never   Smokeless tobacco: Never  Vaping Use   Vaping status: Never Used  Substance Use Topics   Alcohol use: No    Alcohol/week: 0.0 standard drinks of alcohol   Drug use: No   Social History   Social History Narrative   Are you right handed or left handed? Right Handed    Are you currently employed ? No    What is your current occupation? Retired after 28 years   Do you live at home alone? No   Who lives with you? With husband Courtney Harper    What type of home do you live in: 1 story or 2 story? Lives in a one story home          Objective:  Vital Signs:  There were no  vitals taken for this visit.  ***  Labs and Imaging review: New results: ***  Previously reviewed results: ***  Assessment/Plan:  This is Courtney Harper, a 78 y.o. female with: ***   Plan: ***  Return to clinic in ***  Total time spent reviewing records, interview, history/exam, documentation, and coordination of care on day of encounter:  *** min  Rommie Coats, MD

## 2023-07-08 LAB — MISC LABCORP TEST (SEND OUT): Labcorp test code: 4234

## 2023-07-09 ENCOUNTER — Ambulatory Visit: Payer: Medicare Other | Admitting: Neurology

## 2023-07-10 ENCOUNTER — Ambulatory Visit: Payer: Medicare Other | Admitting: Neurology

## 2023-07-14 ENCOUNTER — Encounter: Payer: Self-pay | Admitting: Neurology

## 2023-07-14 NOTE — Progress Notes (Deleted)
 NEUROLOGY FOLLOW UP OFFICE NOTE  Louvena Fess [id number]  Subjective:  Courtney Harper is a 78 y.o. 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  To briefly review: 02/27/23 Patient took gabapentin given by ENT for chronic cough for 14 months. She stopped in 11/2022. On 12/10/22 patient was watching TV and noticed acute onset right sided vision loss. It was present when covering either eye. It lasted a couple of hours then resolved. She had no further neurologic symptoms.   2 days later she started having hot flashes. She then started having very blurry vision. She cannot read well or see the TV. This does not come and go. She also endorses lightheadedness and blurry vision. She denies pain or headache. She is not a headache person. She will have pressure on the medial aspect of both eyes. She also has very watery eyes bilaterally. This will last about 10 minutes. She has chronically had phonophobia, but more recently had photophobia. She denies nausea and vomiting. She has noticed her BP will be high (SBP in 150s) during hot flashes. Per patient, this has not been looked into.   Of note, patient had lost total vision while on interferon for melanoma in 2002. It lasted two days then symptoms resolved. Patient mentions an episode of passing out in 1973 and found to have pituitary problem. She was on hormones for a while and never had recurrence.   She was evaluated by ophthalmology were testing was normal. Her visual acuity was not checked per patient. She still has blurry vision even when wearing her glasses, which husband says she does not do often. Patient had concerns about pituitary and thyroid so prolactin and TSH were checked and were normal.   Of note, patient was seen by Dr. Fulton Job in vascular surgery on 01/13/23 for partial visual field  loss in 11/2022 to rule out TIA. There was no significant carotid disease seen though. Patient has been taking aspirin 81 mg daily since this visit. She has been on Lipitor 20 mg daily since the 1990s.   Patient was given Florette Hurry (sample) but patient has never taken as she does not have a headache.   She denies ptosis, dysarthria, dysphagia, difficulty swallowing, orthopnea, and no weakness of arms or legs. Her blurry vision does not fluctuate.   She denies fevers, temporal pain, or jaw claudication.   Caffeine use: 2 cups of coffee per day Non-smoker EtOH use: rare glass of wine Restrictive diet: on weight watchers, has lost 35 lbs on purpose the last year Family history of neurologic disease: father had a stroke  Most recent Assessment and Plan (02/27/23): Courtney Harper is a 78 y.o. female who presents for evaluation of episode of right sided vision loss lasting 2 hours and more recent, constant blurry vision, hot flashes, lightheadedness. 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 Her neurological examination is pertinent for eye closure weakness, proximal muscle weakness of legs, and possible fatigable weakness in the arms. Available diagnostic data is significant for recent TSH and prolactin being normal. Normal Carotid ultrasound. Patient's initial episode of right sided vision loss that lasted 2 hours sounds consistent with right homonomous hemianopia and concerning for TIA or stroke. The more recent blurry vision is less clear. This could be as benign  as needed new glasses, but given possible fatigable weakness seen on exam, I also consider myasthenia gravis as possible. How the hot flashes and lightheadedness would be related to MG would be less clear. A intracranial pathology is also possible, so MRI is warranted for multiple reasons. I would like her to continue asa 81 mg and lipitor 20 mg daily for now, treating the  initial episode as a TIA.    PLAN: -Blood work: B1, B12, AChR abs -Patient will get me most recent lipid panel. May need to adjust lipitor. -MRI brain and orbits w/wo contrast  -Continue aspirin 81 mg daily -Continue Lipitor 20 mg daily for now  Since their last visit: Patient had not had a recent lipid panel, so this was added to labs. LDL was 118. I recommended increasing lipitor to 40 mg daily. Other labs, including MG panel were normal.  Per my phone note from 03/23/23: Called patient to discuss the results of her MRI brain and orbits. 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   She had an episode 03/10/23 where she had flashing lights and her BP was 200s. She had some pressure between her eyes. She went to the ED, but has had no other problems with BP. She still has blurry vision that is constant and hot flashes. Her BP has otherwise not ever been higher than 140 during the hot flashes. She denies pain.   She is seeing ENT for ear problems, but it is hard to imagine how this would be causing symptoms.   I explained that I did not have a good explanation for her symptoms currently, but she will monitor her symptoms and keep me up to date. I will see her back in clinic in a few months as well.  She sent me the following message on 03/24/23:  I woke up to what felt like a stiff neck at 3am  Both sides of my neck were very stiff & sore,  pain radiated to the back of my head, along with an increase in blurry vision when I was laying down I got up, sat in a recliner, took a Tylenol and pain and blurry vision cleared in about 1 hour.  I did rest in the recliner for approximately 2 hours and had no other symptoms Presently no stiffness in the neck and honestly feel fine,  taking it slow and moving around.  I feel my best when I am standing & moving around.  This never happened before.  I did have pressure tests done in my ears  yesterday during the hearing test thought I mention that.  My ENT Dr Jacque Math [facility] [place] is going to review the MRI and has set up appointment for 04/07/23 to decide what the treatment for the eustachisn tube dysfunction will be   ***  MEDICATIONS:  Outpatient Encounter Medications as of 07/16/2023  Medication Sig   aspirin EC (ASPIR-LOW) 81 MG tablet    atorvastatin  (LIPITOR) 40 MG tablet Take 1 tablet (40 mg total) by mouth daily.   No facility-administered encounter medications on file as of 07/16/2023.    PAST MEDICAL HISTORY: Past Medical History:  Diagnosis Date   Arthritis    Fatty liver    GERD (gastroesophageal reflux disease)    Hypercholesteremia    Melanoma (HCC)     PAST SURGICAL HISTORY: Past Surgical History:  Procedure Laterality Date   ABDOMINAL HYSTERECTOMY  1987   APPENDECTOMY  1987   CHOLECYSTECTOMY  01/2013   DIAPHRAGMATIC HERNIA REPAIR      ALLERGIES: Allergies  Allergen Reactions   Bee Venom Rash and Swelling    FAMILY HISTORY: Family History  Problem Relation Age of Onset   Heart disease Mother    Diabetes Father    Kidney disease Father    Rectal cancer Maternal Uncle        age 15's dx   Colon polyps Neg Hx    Gallbladder disease Neg Hx    Esophageal cancer Neg Hx     SOCIAL HISTORY: Social History   Tobacco Use   Smoking status: Never   Smokeless tobacco: Never  Vaping Use   Vaping status: Never Used  Substance Use Topics   Alcohol use: No    Alcohol/week: 0.0 standard drinks of alcohol   Drug use: No   Social History   Social History Narrative   Are you right handed or left handed? Right Handed    Are you currently employed ? No    What is your current occupation? Retired after 28 years   Do you live at home alone? No   Who lives with you? With husband Rich Champ    What type of home do you live in: 1 story or 2 story? Lives in a one story home          Objective:  Vital Signs:  There were no  vitals taken for this visit.  ***  Labs and Imaging review: New results: Lipid panel (03/06/23): tChol, LDL 118, TG 33  02/27/23: MuSK ab neg AChR abs (binding, blocking, modulating): negative B12 404 B1 wnl  MRI brain and orbits w/wo contrast (03/20/23): FINDINGS: MRI HEAD FINDINGS   Brain:   Parenchymal volume appears within normal limits for age.   7 mm extra-axial enhancing focus overlying the anterolateral right temporal lobe most consistent with a meningioma (series 22, image 77). No significant mass effect upon the underlying brain parenchyma. No underlying parenchymal edema.   Multifocal T2 FLAIR hyperintense signal abnormality within the cerebral white matter, nonspecific but compatible with mild chronic small vessel ischemic disease.   No cortical encephalomalacia is identified.   There is no acute infarct.   No chronic intracranial blood products.   No extra-axial fluid collection.   No midline shift.   Vascular: Maintained flow voids within the proximal large arterial vessels.   Skull and upper cervical spine: No focal worrisome marrow lesion. Incompletely assessed cervical spondylosis.   MRI ORBITS FINDINGS   Orbits: Prior right ocular lens replacement. The globes are normal in size and contour. The extraocular muscles, optic nerve sheath complexes and lacrimal glands are symmetric and unremarkable. No orbital mass or pathologic orbital enhancement.   Visualized sinuses: Trace mucosal thickening within the bilateral ethmoid and right maxillary sinuses.   Soft tissues: The visualized maxillofacial and upper neck soft tissues are unremarkable.   MRI brain impression #3 will be called to the ordering clinician or representative by the Radiologist Assistant, and communication documented in the PACS or [employer].   IMPRESSION: MRI brain:   1. No evidence of an acute or recent subacute infarction. 2. Mild chronic small vessel ischemic  changes within the cerebral white matter. 3. 7 mm extra-axial enhancing focus overlying the anterolateral right temporal lobe most consistent with a meningioma 4. Otherwise unremarkable MRI appearance of the brain for age.   MRI orbits:   1. Prior right ocular lens replacement.  2. Otherwise unremarkable MRI appearance of the orbits. 3. Minor mucosal thickening within the bilateral ethmoid and right maxillary sinuses.  Previously reviewed results: External labs: TSH (09/25/22): wnl Prolactin (09/25/22): wnl HbA1c (10/21/21): 5.4 CMP (10/21/21): alk phos mildly elevated to 177, otherwise unremarkable CBC (10/21/21): unremarkable Lipid panel (10/21/21): tChol 168, LDL 110, TG 66   Imaging: Carotid ultrasound (01/13/23): Summary:  Right Carotid: There is no evidence of stenosis in the right ICA.   Left Carotid: Velocities in the left ICA are consistent with a 1-39%  stenosis.   Vertebrals: Bilateral vertebral arteries demonstrate antegrade flow.  Subclavians: Normal flow hemodynamics were seen in bilateral subclavian               arteries.   Assessment/Plan:  This is Deanie Ewings, a 78 y.o. female with: ***   Plan: ***  Return to clinic in ***  Total time spent reviewing records, interview, history/exam, documentation, and coordination of care on day of encounter:  *** min  Rommie Coats, MD

## 2023-07-16 ENCOUNTER — Ambulatory Visit: Admitting: Neurology

## 2023-07-28 NOTE — Progress Notes (Signed)
 NEUROLOGY FOLLOW UP OFFICE NOTE  Courtney Harper [id number]  Subjective:  Courtney Harper is a 78 y.o. 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  To briefly review: 02/27/23 Patient took gabapentin given by ENT for chronic cough for 14 months. She stopped in 11/2022. On 12/10/22 patient was watching TV and noticed acute onset right sided vision loss. It was present when covering either eye. It lasted a couple of hours then resolved. She had no further neurologic symptoms.   2 days later she started having hot flashes. She then started having very blurry vision. She cannot read well or see the TV. This does not come and go. She also endorses lightheadedness and blurry vision. She denies pain or headache. She is not a headache person. She will have pressure on the medial aspect of both eyes. She also has very watery eyes bilaterally. This will last about 10 minutes. She has chronically had phonophobia, but more recently had photophobia. She denies nausea and vomiting. She has noticed her BP will be high (SBP in 150s) during hot flashes. Per patient, this has not been looked into.   Of note, patient had lost total vision while on interferon for melanoma in 2002. It lasted two days then symptoms resolved. Patient mentions an episode of passing out in 1973 and found to have pituitary problem. She was on hormones for a while and never had recurrence.   She was evaluated by ophthalmology were testing was normal. Her visual acuity was not checked per patient. She still has blurry vision even when wearing her glasses, which husband says she does not do often. Patient had concerns about pituitary and thyroid so prolactin and TSH were checked and were normal.   Of note, patient was seen by Dr. Fulton Job in vascular surgery on 01/13/23 for partial visual field  loss in 11/2022 to rule out TIA. There was no significant carotid disease seen though. Patient has been taking aspirin 81 mg daily since this visit. She has been on Lipitor 20 mg daily since the 1990s.   Patient was given Florette Hurry (sample) but patient has never taken as she does not have a headache.   She denies ptosis, dysarthria, dysphagia, difficulty swallowing, orthopnea, and no weakness of arms or legs. Her blurry vision does not fluctuate.   She denies fevers, temporal pain, or jaw claudication.   Caffeine use: 2 cups of coffee per day Non-smoker EtOH use: rare glass of wine Restrictive diet: on weight watchers, has lost 35 lbs on purpose the last year Family history of neurologic disease: father had a stroke  Most recent Assessment and Plan (02/27/23): Courtney Harper is a 78 y.o. female who presents for evaluation of episode of right sided vision loss lasting 2 hours and more recent, constant blurry vision, hot flashes, lightheadedness. 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 Her neurological examination is pertinent for eye closure weakness, proximal muscle weakness of legs, and possible fatigable weakness in the arms. Available diagnostic data is significant for recent TSH and prolactin being normal. Normal Carotid ultrasound. Patient's initial episode of right sided vision loss that lasted 2 hours sounds consistent with right homonomous hemianopia and concerning for TIA or stroke. The more recent blurry vision is less clear. This could be as benign  as needed new glasses, but given possible fatigable weakness seen on exam, I also consider myasthenia gravis as possible. How the hot flashes and lightheadedness would be related to MG would be less clear. A intracranial pathology is also possible, so MRI is warranted for multiple reasons. I would like her to continue asa 81 mg and lipitor 20 mg daily for now, treating the  initial episode as a TIA.    PLAN: -Blood work: B1, B12, AChR abs -Patient will get me most recent lipid panel. May need to adjust lipitor. -MRI brain and orbits w/wo contrast  -Continue aspirin 81 mg daily -Continue Lipitor 20 mg daily for now  Since their last visit: Patient had not had a recent lipid panel, so this was added to labs. LDL was 118. I recommended increasing lipitor to 40 mg daily. Other labs, including MG panel were normal.  Per my phone note from 03/23/23: Called patient to discuss the results of her MRI brain and orbits. 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   She had an episode 03/10/23 where she had flashing lights and her BP was 200s. She had some pressure between her eyes. She went to the ED, but has had no other problems with BP. She still has blurry vision that is constant and hot flashes. Her BP has otherwise not ever been higher than 140 during the hot flashes. She denies pain.   She is seeing ENT for ear problems, but it is hard to imagine how this would be causing symptoms.   I explained that I did not have a good explanation for her symptoms currently, but she will monitor her symptoms and keep me up to date. I will see her back in clinic in a few months as well.  She sent me the following message on 03/24/23: I woke up to what felt like a stiff neck at 3am  Both sides of my neck were very stiff & sore,  pain radiated to the back of my head, along with an increase in blurry vision when I was laying down I got up, sat in a recliner, took a Tylenol and pain and blurry vision cleared in about 1 hour.  I did rest in the recliner for approximately 2 hours and had no other symptoms Presently no stiffness in the neck and honestly feel fine,  taking it slow and moving around.  I feel my best when I am standing & moving around.  This never happened before.  I did have pressure tests done in my ears  yesterday during the hearing test thought I mention that.  My ENT Dr Jacque Math [facility] [place] is going to review the MRI and has set up appointment for 04/07/23 to decide what the treatment for the eustachisn tube dysfunction will be   Patient continues to have big fluctuations in BP. Her internist is working this up with hormone testing and will be getting a renal ultrasound soon.  Patient wakes up around 4 pm with hot flashes. She will check her BP and it will be in 130s-150s, then will go down to 100s.  She still having blurry vision, but no vision loss. She has seen eye doctor who said eyes looked good. She had a cataract removal on right and had a laser to the membrane.   Her ENT told her she has eustachian tube dysfunction. She is following up with ENT next week.  She has also seen NSGY who is monitoring the meningioma.  MEDICATIONS:  Outpatient Encounter Medications as of 07/31/2023  Medication Sig   aspirin EC (ASPIR-LOW) 81 MG tablet    atorvastatin  (LIPITOR) 40 MG tablet Take 1 tablet (40 mg total) by mouth daily.   No facility-administered encounter medications on file as of 07/31/2023.    PAST MEDICAL HISTORY: Past Medical History:  Diagnosis Date   Arthritis    Fatty liver    GERD (gastroesophageal reflux disease)    Hypercholesteremia    Melanoma (HCC)     PAST SURGICAL HISTORY: Past Surgical History:  Procedure Laterality Date   ABDOMINAL HYSTERECTOMY  1987   APPENDECTOMY  1987   CHOLECYSTECTOMY  01/2013   DIAPHRAGMATIC HERNIA REPAIR      ALLERGIES: Allergies  Allergen Reactions   Bee Venom Rash and Swelling    FAMILY HISTORY: Family History  Problem Relation Age of Onset   Heart disease Mother    Diabetes Father    Kidney disease Father    Rectal cancer Maternal Uncle        age 52's dx   Colon polyps Neg Hx    Gallbladder disease Neg Hx    Esophageal cancer Neg Hx     SOCIAL HISTORY: Social History   Tobacco Use   Smoking  status: Never   Smokeless tobacco: Never  Vaping Use   Vaping status: Never Used  Substance Use Topics   Alcohol use: No    Alcohol/week: 0.0 standard drinks of alcohol   Drug use: No   Social History   Social History Narrative   Are you right handed or left handed? Right Handed    Are you currently employed ? No    What is your current occupation? Retired after 28 years   Do you live at home alone? No   Who lives with you? With husband Courtney Harper    What type of home do you live in: 1 story or 2 story? Lives in a one story home          Objective:  Vital Signs:  BP 116/62   Pulse 83   Ht 5\' 3"  (1.6 m)   Wt 177 lb (80.3 kg)   SpO2 97%   BMI 31.35 kg/m   Orthostatic BP 116/62 154/78 142/78 142/76  BP Location -- Left Arm Left Arm Left Arm  Patient Position -- Supine Sitting Standing  Cuff Size -- Normal Normal Normal  Orthostatic Pulse 83 77 83 87  Weight 177 lb (80.3 kg) -- -- --  Height 5\' 3"  (1.6 m) -- -- --  SpO2 97 % 98 % 95 % 94 %    General: No acute distress.  Patient appears well-groomed.   Head:  Normocephalic/atraumatic Neck: supple Back: No paraspinal tenderness Heart: regular rate and rhythm Lungs: Clear to auscultation bilaterally.  Neurological Exam: Mental status: alert and oriented, speech fluent and not dysarthric, language intact.  Cranial nerves: CN I: not tested CN II: pupils equal, round and reactive to light, visual fields intact CN III, IV, VI:  full range of motion, no nystagmus, no ptosis CN V: facial sensation intact. CN VII: upper and lower face symmetric CN VIII: hearing intact CN IX, X: uvula midline CN XI: sternocleidomastoid and trapezius muscles intact CN XII: tongue midline  Bulk & Tone: normal, no fasciculations. Motor:  muscle strength 5/5 throughout Deep Tendon Reflexes:  2+ throughout.   Sensation:  Light touch sensation intact. Finger to nose testing:  Without dysmetria.   Coordination: Finger tapping normal  bilaterally. No bradykinesia.  Gait:  Normal station and stride. Lightheadedness gets worse when she stands.   Labs and Imaging review: New results: Lipid panel (03/06/23): tChol, LDL 118, TG 33  02/27/23: MuSK ab neg AChR abs (binding, blocking, modulating): negative B12 404 B1 wnl  MRI brain and orbits w/wo contrast (03/20/23): FINDINGS: MRI HEAD FINDINGS   Brain:   Parenchymal volume appears within normal limits for age.   7 mm extra-axial enhancing focus overlying the anterolateral right temporal lobe most consistent with a meningioma (series 22, image 77). No significant mass effect upon the underlying brain parenchyma. No underlying parenchymal edema.   Multifocal T2 FLAIR hyperintense signal abnormality within the cerebral white matter, nonspecific but compatible with mild chronic small vessel ischemic disease.   No cortical encephalomalacia is identified.   There is no acute infarct.   No chronic intracranial blood products.   No extra-axial fluid collection.   No midline shift.   Vascular: Maintained flow voids within the proximal large arterial vessels.   Skull and upper cervical spine: No focal worrisome marrow lesion. Incompletely assessed cervical spondylosis.   MRI ORBITS FINDINGS   Orbits: Prior right ocular lens replacement. The globes are normal in size and contour. The extraocular muscles, optic nerve sheath complexes and lacrimal glands are symmetric and unremarkable. No orbital mass or pathologic orbital enhancement.   Visualized sinuses: Trace mucosal thickening within the bilateral ethmoid and right maxillary sinuses.   Soft tissues: The visualized maxillofacial and upper neck soft tissues are unremarkable.   MRI brain impression #3 will be called to the ordering clinician or representative by the Radiologist Assistant, and communication documented in the PACS or [employer].   IMPRESSION: MRI brain:   1. No evidence of an  acute or recent subacute infarction. 2. Mild chronic small vessel ischemic changes within the cerebral white matter. 3. 7 mm extra-axial enhancing focus overlying the anterolateral right temporal lobe most consistent with a meningioma 4. Otherwise unremarkable MRI appearance of the brain for age.   MRI orbits:   1. Prior right ocular lens replacement. 2. Otherwise unremarkable MRI appearance of the orbits. 3. Minor mucosal thickening within the bilateral ethmoid and right maxillary sinuses.  Previously reviewed results: External labs: TSH (09/25/22): wnl Prolactin (09/25/22): wnl HbA1c (10/21/21): 5.4 CMP (10/21/21): alk phos mildly elevated to 177, otherwise unremarkable CBC (10/21/21): unremarkable Lipid panel (10/21/21): tChol 168, LDL 110, TG 66   Imaging: Carotid ultrasound (01/13/23): Summary:  Right Carotid: There is no evidence of stenosis in the right ICA.   Left Carotid: Velocities in the left ICA are consistent with a 1-39%  stenosis.   Vertebrals: Bilateral vertebral arteries demonstrate antegrade flow.  Subclavians: Normal flow hemodynamics were seen in bilateral subclavian               arteries.   Assessment/Plan:  This is Courtney Harper, a 78 y.o. female with constant blurry vision and lightheadedness. She describes wild fluctuations in blood pressure and hot flashes. This is likely why she feels lightheaded. The etiology of the fluctuations is not currently clear to me. Her orthostatic vitals today were normal. An internist is working this up currently. From a neurologic perspective, there is no evidence of parkinsonism to suggest central etiology such as MSA. There is no peripheral neuropathy symptoms such as burning or tingling to suggest small fiber neuropathy. Autonomic dysfunction is possible but orthostatic vitals are normal, so this appears  less likely. She is currently being worked up by internal medicine as mentioned and could have endocrine issue,  particularly because she has had issues with pituitary in the past. She also mentions she had been on gabapentin 100 mg TID for cough and similar symptoms in the past that was helping. Her symptoms started when she stopped it per patient and her husband. How gabapentin would help symptoms is unclear to me, but 100 mg TID is a very low dose, so I did offer to prescribe to see if this helped. Patient will let me know if she wants to try this after upcoming testing.  Plan: -Follow up with internal medicine for further testing as planned -Am willing to trial gabapentin 100 mg TID for symptoms as patient states this helped in the past    Return to clinic as needed  Total time spent reviewing records, interview, history/exam, documentation, and coordination of care on day of encounter:  50 min  Rommie Coats, MD

## 2023-07-31 ENCOUNTER — Ambulatory Visit (INDEPENDENT_AMBULATORY_CARE_PROVIDER_SITE_OTHER): Admitting: Neurology

## 2023-07-31 ENCOUNTER — Encounter: Payer: Self-pay | Admitting: Neurology

## 2023-07-31 VITALS — Ht 63.0 in | Wt 177.0 lb

## 2023-07-31 DIAGNOSIS — D329 Benign neoplasm of meninges, unspecified: Secondary | ICD-10-CM | POA: Diagnosis not present

## 2023-07-31 DIAGNOSIS — R42 Dizziness and giddiness: Secondary | ICD-10-CM

## 2023-07-31 DIAGNOSIS — H538 Other visual disturbances: Secondary | ICD-10-CM | POA: Diagnosis not present

## 2023-07-31 NOTE — Patient Instructions (Signed)
 I am no seeing a clear neurologic cause of your symptoms. I am keeping an open mind if anyone has concerns I have not considered though.  If you want to try gabapentin as symptoms started after stopping it, I would be open to prescribing to see if it helped, though why this would help is not clear to me.  Follow up with me as needed. Please let me know if you have any questions or concerns in the meantime.   The physicians and staff at [facility] are committed to providing excellent care. You may receive a survey requesting feedback about your experience at our office. We strive to receive "very good" responses to the survey questions. If you feel that your experience would prevent you from giving the office a "very good " response, please contact our office to try to remedy the situation. We may be reached at [phone]. Thank you for taking the time out of your busy day to complete the survey.  Rommie Coats, MD [facility]

## 2023-11-12 ENCOUNTER — Ambulatory Visit: Admitting: Neurology

## 2023-11-18 NOTE — Progress Notes (Unsigned)
 Cardiology Office Note    Date:  11/19/2023  ID:  Courtney Harper, DOB 1945/11/20, MRN [id number] PCP:  Courtney Candyce HERO, MD  Cardiologist:  Courtney Harper [place], MD  Electrophysiologist:  None   Chief Complaint: f/u aortic atherosclerosis  History of Present Illness: .    Courtney Harper is a 78 y.o. female with visit-pertinent history of HLD, aortic atherosclerosis, mild carotid disease (1-39% LICA in 01/2023), fatty liver, arthritis, metastatic melanoma, arthritis. She saw Dr. Mallipeddi in 11/2022 due to imaging evidence of heavy atherosclerotic calcification of the abdominal aorta. Aortic duplex was negative for AAA. Carotid duplex showed 1-39% LICA.   She has also had workup at [facility] in the past: - 06/2021 stress echo was normal. EF 55-60% at rest, 65-70% with stress - 05/2023 monitor: Patient had a min HR of 55 bpm, max HR of 167 bpm, and avg HR of 81 bpm. Predominant underlying rhythm was Sinus Rhythm. Slight P wave morphology changes were noted. 7 Supraventricular Tachycardia runs occurred, the run with the fastest interval lasting 8 beats with a max rate of 167 bpm, the longest lasting 12 beats with an avg rate of 130 bpm. Isolated SVEs were rare  <1.0% , SVE Couplets were rare  <1.0% , and SVE Triplets were rare  <1.0% . Isolated VEs were rare  <1.0% , VE Couplets were rare.  <1.0% , and no VE Triplets were present. FINAL PROVIDER INTERPRETATION: Agree with Findings. Triggered events associated with sinus rhythm and rare isolated PACs. Triggered events were very frequent.  - pelvic US  07/2023 reported no evidence of any significant main renal artery stenosis bilaterally - notes indicate she is being worked up there for labile changes in blood pressure, unexplained HTN, flushing, blurry vision, hearing loss, aural fullness. She has seen multiple specialties including neurology, ENT, nephrology, IM, oncology. MRI brain showed meningioma and less conspicuous enhancement in the  lateral aspect of the right internal auditory canal favored to represent improving neuritis versus Scarpa's ganglion. Neurology offered trial of gabapentin. IM notes indicate they doubted adrenal insufficiency based on cortisol levels. Per notes, sounds like pheochromocytoma was ruled out. Per nephrology note, These renal episodes are unlikely to be from renal-derived etiology; suspect autonomic dysfunction. Will assess paraproteinemia labs for possible contribution w/ amyloid. 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 She also has an appointment set up to see endocrinology.  She is here with her husband. She reports that since September of 2024, every morning she is awoken by sensation of flushing/warmth, elevated blood pressure, blurred vision, and unexplained spontaneous HTN - highest BPs 200s in the past, example provided was 170s/80s. She reports it has been very frustrating finding a diagnosis. Within a few minutes to hours, her blood pressure normalizes and symptoms resolve and she goes on about her day. She had her husband have noticed she has had worsening DOE during this timeframe. No chest pain.  Labwork independently reviewed: 06/2023 CBC wnl, K 4.3, Cr 0.71, AP 152 up in 2016 as well, LFTs o/w unremarkable 02/2023 LDL 118, trig 33 2019 TSH OK  ROS: .    Please see the history of present illness.  All other systems are reviewed and otherwise negative.  Studies Reviewed: SABRA    EKG:  EKG is ordered today, personally reviewed, demonstrating   EKG Interpretation Date/Time:  Thursday November 19 2023 13:32:13 EDT Ventricular Rate:  83 PR Interval:  154 QRS Duration:  70 QT Interval:  340 QTC  Calculation: 399 R Axis:   13  Text Interpretation: Normal sinus rhythm Normal ECG When compared with ECG of 18-Nov-2022 15:05, Minimal criteria for Anterior infarct are no longer Present Confirmed by Courtney Harper [id number]) on 11/19/2023 2:06:45 PM         CV Studies:  Cardiac studies reviewed are outlined and summarized above. Otherwise please see EMR for full report.   Current Reported Medications:.    Current Meds  Medication Sig   aspirin EC (ASPIR-LOW) 81 MG tablet Takes 1 tablet (81mg ) by mouth daily   atorvastatin  (LIPITOR) 40 MG tablet Take 1 tablet (40 mg total) by mouth daily.    Physical Exam:    VS:  BP 122/82 (BP Location: Right Arm, Cuff Size: Normal)   Pulse 83   Ht 5' 5 (1.651 m)   Wt 177 lb (80.3 kg)   SpO2 99%   BMI 29.45 kg/m    Wt Readings from Last 3 Encounters:  11/19/23 177 lb (80.3 kg)  07/31/23 177 lb (80.3 kg)  02/27/23 165 lb (74.8 kg)    GEN: Well nourished, well developed in no acute distress NECK: No JVD; No carotid bruits CARDIAC: RRR, no murmurs, rubs, gallops RESPIRATORY:  Clear to auscultation without rales, wheezing or rhonchi  ABDOMEN: Soft, non-tender, non-distended EXTREMITIES:  No edema; No acute deformity   Asessement and Plan:.    1. Labile HTN, flushing, DOE - unusual constellation of symptoms. Has had PCP, nephrology, oncology, ENT, neurology workup. Pelvic US  previously commented no evidence of significant renal artery stenosis. Seems unclear cause from notes. Will check TSH, free T4, and urine 5HIAA to evaluate for carcinoid syndrome. She also describes some dyspnea on exertion. Will obtain echocardiogram and coronary CTA for completeness. Get updated CBC, CMET as well. Will get lipid profile when she returns fasting.  2. Aortic atherosclerosis, HLD - check lipid profile when she returns for echo (not fasting today). Note her alk phos has been elevated for unclear reasons. Further mgmt per primary care.  3. Mild carotid artery disease - due for carotid duplex 01/2024, will arrange. Do not suspect contributing to symptoms since 11/2022 study did not show any obstructive findings.    Disposition: F/u with me or APP in 6 weeks.  Signed, [name], PA-C

## 2023-11-19 ENCOUNTER — Encounter: Payer: Self-pay | Admitting: Physician Assistant

## 2023-11-19 ENCOUNTER — Other Ambulatory Visit (HOSPITAL_COMMUNITY)
Admission: RE | Admit: 2023-11-19 | Discharge: 2023-11-19 | Disposition: A | Discharge status: Home / Self Care | Source: Ambulatory Visit | Attending: Physician Assistant | Admitting: Physician Assistant

## 2023-11-19 ENCOUNTER — Ambulatory Visit (INDEPENDENT_AMBULATORY_CARE_PROVIDER_SITE_OTHER): Admitting: Physician Assistant

## 2023-11-19 VITALS — BP 122/82 | HR 83 | Ht 65.0 in | Wt 177.0 lb

## 2023-11-19 DIAGNOSIS — E785 Hyperlipidemia, unspecified: Secondary | ICD-10-CM | POA: Diagnosis not present

## 2023-11-19 DIAGNOSIS — R0609 Other forms of dyspnea: Secondary | ICD-10-CM

## 2023-11-19 DIAGNOSIS — Z79899 Other long term (current) drug therapy: Secondary | ICD-10-CM | POA: Insufficient documentation

## 2023-11-19 DIAGNOSIS — R232 Flushing: Secondary | ICD-10-CM

## 2023-11-19 DIAGNOSIS — R0989 Other specified symptoms and signs involving the circulatory and respiratory systems: Secondary | ICD-10-CM | POA: Diagnosis not present

## 2023-11-19 DIAGNOSIS — I700 Atherosclerosis of aorta: Secondary | ICD-10-CM

## 2023-11-19 DIAGNOSIS — I6522 Occlusion and stenosis of left carotid artery: Secondary | ICD-10-CM

## 2023-11-19 LAB — CBC
HCT: 42 % (ref 36.0–46.0)
Hemoglobin: 13.9 g/dL (ref 12.0–15.0)
MCH: 29.7 pg (ref 26.0–34.0)
MCHC: 33.1 g/dL (ref 30.0–36.0)
MCV: 89.7 fL (ref 80.0–100.0)
Platelets: 189 K/uL (ref 150–400)
RBC: 4.68 MIL/uL (ref 3.87–5.11)
RDW: 12.7 % (ref 11.5–15.5)
WBC: 6.2 K/uL (ref 4.0–10.5)
nRBC: 0 % (ref 0.0–0.2)

## 2023-11-19 LAB — COMPREHENSIVE METABOLIC PANEL WITH GFR
ALT: 24 U/L (ref 0–44)
AST: 28 U/L (ref 15–41)
Albumin: 3.8 g/dL (ref 3.5–5.0)
Alkaline Phosphatase: 149 U/L — ABNORMAL HIGH (ref 38–126)
Anion gap: 12 (ref 5–15)
BUN: 19 mg/dL (ref 8–23)
CO2: 23 mmol/L (ref 22–32)
Calcium: 9.3 mg/dL (ref 8.9–10.3)
Chloride: 104 mmol/L (ref 98–111)
Creatinine, Ser: 0.69 mg/dL (ref 0.44–1.00)
GFR, Estimated: >60 mL/min (ref >60)
Glucose, Bld: 91 mg/dL (ref 70–99)
Potassium: 4.1 mmol/L (ref 3.5–5.1)
Sodium: 139 mmol/L (ref 135–145)
Total Bilirubin: 0.8 mg/dL (ref 0.0–1.2)
Total Protein: 7.1 g/dL (ref 6.5–8.1)

## 2023-11-19 LAB — TSH: TSH: 2.038 u[IU]/mL (ref 0.350–4.500)

## 2023-11-19 LAB — T4, FREE: Free T4: 0.92 ng/dL (ref 0.61–1.12)

## 2023-11-19 MED ORDER — METOPROLOL TARTRATE 100 MG PO TABS: 100.0000 mg | ORAL_TABLET | ORAL | 0 refills | Status: DC

## 2023-11-19 NOTE — Patient Instructions (Signed)
 Medication Instructions:  Your physician recommends that you continue on your current medications as directed. Please refer to the Current Medication list given to you today.  *If you need a refill on your cardiac medications before your next appointment, please call your pharmacy*  Lab Work: Your physician recommends that you return for lab work in: Today   Your physician recommends that you return for lab work in: Fasting the day of your Echo   If you have labs (blood work) drawn today and your tests are completely normal, you will receive your results only by: MyChart Message (if you have MyChart) OR A paper copy in the mail If you have any lab test that is abnormal or we need to change your treatment, we will call you to review the results.  Testing/Procedures: Your physician has requested that you have an echocardiogram. Echocardiography is a painless test that uses sound waves to create images of your heart. It provides your doctor with information about the size and shape of your heart and how well your heart's chambers and valves are working. This procedure takes approximately one hour. There are no restrictions for this procedure. Please do NOT wear cologne, perfume, aftershave, or lotions (deodorant is allowed). Please arrive 15 minutes prior to your appointment time.  Please note: We ask at that you not bring children with you during ultrasound (echo/ vascular) testing. Due to room size and safety concerns, children are not allowed in the ultrasound rooms during exams. Our front office staff cannot provide observation of children in our lobby area while testing is being conducted. An adult accompanying a patient to their appointment will only be allowed in the ultrasound room at the discretion of the ultrasound technician under special circumstances. We apologize for any inconvenience.  Your physician has requested that you have a carotid duplex. This test is an ultrasound of the  carotid arteries in your neck. It looks at blood flow through these arteries that supply the brain with blood. Allow one hour for this exam. There are no restrictions or special instructions.    Your cardiac CT will be scheduled at one of the below locations:   [facility] [address] [place], [place] [address] [phone] (Severe contrast allergies only)  OR   [facility] [address] [place], [place] [address] [phone]  OR   [facility] [address]  [place], [place] [address] [phone]  If scheduled at [facility], please arrive at the [facility] and Children's Entrance (Entrance C2) of [facility] 30 minutes prior to test start time. You can use the FREE valet parking offered at entrance C (encouraged to control the heart rate for the test)  Proceed to the [facility] Radiology Department (first floor) to check-in and test prep.  All radiology patients and guests should use entrance C2 at [facility], accessed from [facility], even though the hospital's physical address listed is [address]  If scheduled at the Heart and Vascular Tower at [address], please enter the parking lot using the [address] entrance and use the FREE valet service at the patient drop-off area. Enter the building and check-in with registration on the main floor.  If scheduled at [facility], please arrive to the Heart and Vascular Center 15 mins early for check-in and test prep.  There is spacious parking and easy access to the radiology department from  the [facility] V[facility] entrance. Please enter here and check-in with the desk attendant.   If scheduled at [facility], please arrive 30 minutes early for check-in and test prep.  Please follow these instructions carefully (unless otherwise directed):  An IV will be required for this  test and Nitroglycerin will be given.  Hold all erectile dysfunction medications at least 3 days (72 hrs) prior to test. (Ie viagra, cialis, sildenafil, tadalafil, etc)   On the Night Before the Test: Be sure to Drink plenty of water. Do not consume any caffeinated/decaffeinated beverages or chocolate 12 hours prior to your test. Do not take any antihistamines 12 hours prior to your test.  On the Day of the Test: Drink plenty of water until 1 hour prior to the test. Do not eat any food 1 hour prior to test. You may take your regular medications prior to the test.  Take metoprolol  (Lopressor ) two hours prior to test. If you take Furosemide/Hydrochlorothiazide/Spironolactone/Chlorthalidone, please HOLD on the morning of the test. Patients who wear a continuous glucose monitor MUST remove the device prior to scanning. FEMALES- please wear underwire-free bra if available, avoid dresses & tight clothing  *For Clinical Staff only. Please instruct patient the following:* Heart Rate Medication Recommendations for Cardiac CT  Resting HR < 50 bpm  No medication  Resting HR 50-60 bpm and BP >110/50 mmHG   Consider Metoprolol  tartrate 25 mg PO 90-120 min prior to scan  Resting HR 60-65 bpm and BP >110/50 mmHG  Metoprolol  tartrate 50 mg PO 90-120 minutes prior to scan   Resting HR > 65 bpm and BP >110/50 mmHG  Metoprolol  tartrate 100 mg PO 90-120 minutes prior to scan  Consider Ivabradine 10-15 mg PO or a calcium  channel blocker for resting HR >60 bpm and contraindication to metoprolol  tartrate  Consider Ivabradine 10-15 mg PO in combination with metoprolol  tartrate for HR >80 bpm        After the Test: Drink plenty of water. After receiving IV contrast, you may experience a mild flushed feeling. This is normal. On occasion, you may experience a mild rash up to 24 hours after the test. This is not dangerous. If this occurs, you can take Benadryl 25 mg, Zyrtec, Claritin, or Allegra and increase  your fluid intake. (Patients taking Tikosyn should avoid Benadryl, and may take Zyrtec, Claritin, or Allegra) If you experience trouble breathing, this can be serious. If it is severe call 911 IMMEDIATELY. If it is mild, please call our office.  We will call to schedule your test 2-4 weeks out understanding that some insurance companies will need an authorization prior to the service being performed.   For more information and frequently asked questions, please visit our website : [email or web address]  For non-scheduling related questions, please contact the cardiac imaging nurse navigator should you have any questions/concerns: Cardiac Imaging Nurse Navigators Direct Office Dial: [phone]   For scheduling needs, including cancellations and rescheduling, please call [place], [phone].   Follow-Up: At [facility], you and your health needs are our priority.  As part of our continuing mission to provide you with exceptional heart care, our providers are all part of one team.  This team includes your primary Cardiologist (physician) and Advanced Practice Providers or APPs (Physician Assistants and Nurse Practitioners) who all work together to provide you with the care you need, when you need it.  Your next appointment:   6 week(s)  Provider:   You will see one of  the following Advanced Practice Providers on your designated Care Team:   [place], PA-C  [place], [place] Olivia Pavy, [place]      We recommend signing up for the patient portal called MyChart.  Sign up information is provided on this After Visit Summary.  MyChart is used to connect with patients for Virtual Visits (Telemedicine).  Patients are able to view lab/test results, encounter notes, upcoming appointments, etc.  Non-urgent messages can be sent to your provider as well.   To learn more about what you can do with MyChart, go to [email or web address].   Other  Instructions Thank you for choosing [facility]!

## 2023-11-19 NOTE — Addendum Note (Signed)
 Addended by: Ayla Dunigan G on: 11/19/2023 03:26 PM   Modules accepted: Orders

## 2023-11-20 ENCOUNTER — Ambulatory Visit: Payer: Self-pay | Admitting: Physician Assistant

## 2023-11-23 ENCOUNTER — Other Ambulatory Visit (HOSPITAL_COMMUNITY)
Admission: RE | Admit: 2023-11-23 | Discharge: 2023-11-23 | Disposition: A | Discharge status: Home / Self Care | Source: Ambulatory Visit | Attending: Physician Assistant | Admitting: Physician Assistant

## 2023-11-23 DIAGNOSIS — R232 Flushing: Secondary | ICD-10-CM

## 2023-11-24 ENCOUNTER — Telehealth: Payer: Self-pay | Admitting: Physician Assistant

## 2023-11-24 NOTE — Telephone Encounter (Signed)
 Pt is requesting a callback regarding her wanting results from 24 hr urine test. Please advise

## 2023-11-24 NOTE — Telephone Encounter (Signed)
 Spoke to patient and advised that office has not received lab results for 24 hr urine test yet. Advised pt that office will call when provider sends results. Pt notified understanding and had no further questions or concerns at this time.

## 2023-11-24 NOTE — Addendum Note (Signed)
 Addended by: Rolene Andrades G on: 11/24/2023 07:24 AM   Modules accepted: Orders

## 2023-11-26 ENCOUNTER — Telehealth: Payer: Self-pay | Admitting: Internal Medicine

## 2023-11-26 NOTE — Telephone Encounter (Signed)
 Received notice from the lab that 24 hour urine sample unable to be used. Informed  that the sample will need to be recollected. Pt notified and agrees to recollect sample. Order placed.

## 2023-11-26 NOTE — Telephone Encounter (Signed)
 Pt returning nurse's call. Please advise

## 2023-11-26 NOTE — Addendum Note (Signed)
 Addended by: Alfreda Hammad G on: 11/26/2023 02:42 PM   Modules accepted: Orders

## 2023-11-27 ENCOUNTER — Other Ambulatory Visit (HOSPITAL_COMMUNITY)
Admission: RE | Admit: 2023-11-27 | Discharge: 2023-11-27 | Disposition: A | Discharge status: Home / Self Care | Source: Ambulatory Visit | Attending: Physician Assistant | Admitting: Physician Assistant

## 2023-11-30 ENCOUNTER — Other Ambulatory Visit (HOSPITAL_COMMUNITY)
Admission: RE | Admit: 2023-11-30 | Discharge: 2023-11-30 | Disposition: A | Discharge status: Home / Self Care | Source: Ambulatory Visit | Attending: Physician Assistant | Admitting: Physician Assistant

## 2023-11-30 DIAGNOSIS — R232 Flushing: Secondary | ICD-10-CM | POA: Insufficient documentation

## 2023-11-30 DIAGNOSIS — R42 Dizziness and giddiness: Secondary | ICD-10-CM | POA: Insufficient documentation

## 2023-11-30 DIAGNOSIS — I158 Other secondary hypertension: Secondary | ICD-10-CM | POA: Insufficient documentation

## 2023-12-02 LAB — 5 HIAA, QUANTITATIVE, URINE, 24 HOUR
5-HIAA, Ur: 3.6 mg/L
5-HIAA,Quant.,24 Hr Urine: 6.7 mg/(24.h) (ref 0.0–14.9)
Total Volume: 1850

## 2023-12-03 ENCOUNTER — Ambulatory Visit: Payer: Self-pay | Admitting: Physician Assistant

## 2023-12-04 ENCOUNTER — Ambulatory Visit: Admitting: Neurology

## 2023-12-14 DIAGNOSIS — E785 Hyperlipidemia, unspecified: Secondary | ICD-10-CM

## 2023-12-17 ENCOUNTER — Ambulatory Visit (HOSPITAL_COMMUNITY)
Admission: RE | Admit: 2023-12-17 | Discharge: 2023-12-17 | Disposition: A | Discharge status: Home / Self Care | Source: Ambulatory Visit | Attending: Physician Assistant | Admitting: Physician Assistant

## 2023-12-17 ENCOUNTER — Other Ambulatory Visit (HOSPITAL_COMMUNITY)
Admission: RE | Admit: 2023-12-17 | Discharge: 2023-12-17 | Disposition: A | Source: Ambulatory Visit | Attending: Physician Assistant | Admitting: Physician Assistant

## 2023-12-17 ENCOUNTER — Ambulatory Visit (HOSPITAL_COMMUNITY)

## 2023-12-17 ENCOUNTER — Ambulatory Visit: Payer: Self-pay | Admitting: Physician Assistant

## 2023-12-17 DIAGNOSIS — I10 Essential (primary) hypertension: Secondary | ICD-10-CM | POA: Insufficient documentation

## 2023-12-17 DIAGNOSIS — R0609 Other forms of dyspnea: Secondary | ICD-10-CM | POA: Insufficient documentation

## 2023-12-17 DIAGNOSIS — E785 Hyperlipidemia, unspecified: Secondary | ICD-10-CM | POA: Insufficient documentation

## 2023-12-17 LAB — LIPID PANEL
Cholesterol: 188 mg/dL (ref 0–200)
HDL: 47 mg/dL (ref >40)
LDL Cholesterol: 122 mg/dL — ABNORMAL HIGH (ref 0–99)
Total CHOL/HDL Ratio: 4 ratio
Triglycerides: 92 mg/dL (ref <150)
VLDL: 18 mg/dL (ref 0–40)

## 2023-12-17 LAB — ECHOCARDIOGRAM COMPLETE
Area-P 1/2: 3.12 cm2
S' Lateral: 2.47 cm

## 2023-12-17 NOTE — Progress Notes (Signed)
*  PRELIMINARY RESULTS* Echocardiogram 2D Echocardiogram has been performed.  Courtney Harper 12/17/2023, 9:54 AM

## 2024-01-01 ENCOUNTER — Ambulatory Visit: Admitting: Student

## 2024-01-07 ENCOUNTER — Encounter (HOSPITAL_COMMUNITY): Payer: Self-pay

## 2024-01-09 ENCOUNTER — Other Ambulatory Visit: Payer: Self-pay | Admitting: Neurology

## 2024-01-11 ENCOUNTER — Other Ambulatory Visit: Payer: Self-pay

## 2024-01-11 ENCOUNTER — Ambulatory Visit (HOSPITAL_COMMUNITY)
Admission: RE | Admit: 2024-01-11 | Discharge: 2024-01-11 | Disposition: A | Source: Ambulatory Visit | Attending: Physician Assistant | Admitting: Physician Assistant

## 2024-01-11 DIAGNOSIS — I700 Atherosclerosis of aorta: Secondary | ICD-10-CM | POA: Insufficient documentation

## 2024-01-11 DIAGNOSIS — R0609 Other forms of dyspnea: Secondary | ICD-10-CM | POA: Insufficient documentation

## 2024-01-11 DIAGNOSIS — R0989 Other specified symptoms and signs involving the circulatory and respiratory systems: Secondary | ICD-10-CM | POA: Diagnosis not present

## 2024-01-11 DIAGNOSIS — I2510 Atherosclerotic heart disease of native coronary artery without angina pectoris: Secondary | ICD-10-CM

## 2024-01-11 MED ORDER — IOHEXOL 350 MG/ML SOLN
95.0000 mL | Freq: Once | INTRAVENOUS | Status: AC | PRN
  Administered 2024-01-11: 95 mL via INTRAVENOUS

## 2024-01-11 MED ORDER — ATORVASTATIN CALCIUM 40 MG PO TABS: 40.0000 mg | ORAL_TABLET | Freq: Every day | ORAL | 0 refills | Status: DC

## 2024-01-11 MED ORDER — NITROGLYCERIN 0.4 MG SL SUBL
0.8000 mg | SUBLINGUAL_TABLET | Freq: Once | SUBLINGUAL | Status: AC
  Administered 2024-01-11: 0.8 mg via SUBLINGUAL

## 2024-01-14 ENCOUNTER — Ambulatory Visit: Attending: Physician Assistant

## 2024-01-14 DIAGNOSIS — I6522 Occlusion and stenosis of left carotid artery: Secondary | ICD-10-CM | POA: Diagnosis present

## 2024-01-21 NOTE — Progress Notes (Unsigned)
 Cardiology Office Note    Date:  01/22/2024  ID:  Courtney Harper, DOB 20-Jun-1945, MRN [id number] Cardiologist: Courtney SHAUNNA Maywood, MD Cardiology APP:  Johnson Laymon HERO, PA-C { : History of Present Illness:    Courtney Harper is a 78 y.o. female with past medical history of carotid artery stenosis, HLD, arthritis and metastatic melanoma who presents to the office today for 32-month follow-up.  She was last examined by Raphael Bring, PA in 11/2023 and reported waking up every morning with a sensation of flushing/warmth and BP was significantly elevated with SBP in the 200's at times. She had previously been evaluated at [facility] and undergoing workup by Neurology, ENT, Nephrology, IM and Oncology with MRI showing a meningioma and workup for adrenal insufficiency and pheochromocytoma had been negative. Repeat labs were obtained and there was no evidence of carcinoid syndrome. An echocardiogram, Coronary CTA and carotid dopplers were recommended for further assessment. Echocardiogram showed a preserved EF of 60 to 65% with grade 1 diastolic dysfunction and normal RV function. Coronary CTA showed 30 to 49% stenosis along the RCA but otherwise no significant stenosis. Carotid Doppler showed 1 to 39% stenosis bilaterally.  By review of Care Everywhere, she had follow-up with Endocrinology at [facility] in 12/2023 and plasma renin levels were normal along with normal aldosterone, metanephrines and catecholamines.  In talking with the patient and her husband today, she reports still having variable blood pressure. This is typically worse in the morning and her blood pressure increases with SBP in the 140's to 150's and she feels dizzy and flushed. Heart rate can increase into the 90's to 110's which is abnormal for her. Upon sitting for 5 to 10 minutes, BP and HR normalize. Says that she typically feels the best in the mid afternoon. Has some intermittent dyspnea but no chest pain or palpitations. No  recent orthopnea, PND or pitting edema.  Studies Reviewed:   EKG: EKG is not ordered today.  Holter Monitor: 05/2023 PRELIMINARY FINDINGS  Patient had a min HR of 55 bpm, max HR of 167 bpm, and avg HR of 81 bpm. Predominant underlying rhythm was Sinus Rhythm. Slight P wave morphology changes were noted. 7 Supraventricular Tachycardia runs occurred, the run with the fastest interval lasting 8 beats with a max rate of 167 bpm, the longest lasting 12 beats with an avg rate of 130 bpm. Isolated SVEs were rare  <1.0% , SVE Couplets were rare  <1.0% , and SVE Triplets were rare  <1.0% . Isolated VEs were rare  <1.0% , VE Couplets were rare  <1.0% , and no VE Triplets were present.   Echocardiogram: 12/2023 IMPRESSIONS     1. Left ventricular ejection fraction, by estimation, is 60 to 65%. The  left ventricle has normal function. The left ventricle has no regional  wall motion abnormalities. Left ventricular diastolic parameters are  consistent with Grade I diastolic  dysfunction (impaired relaxation).   2. Right ventricular systolic function is normal. The right ventricular  size is normal. Tricuspid regurgitation signal is inadequate for assessing  PA pressure.   3. The mitral valve is normal in structure. Trivial mitral valve  regurgitation. No evidence of mitral stenosis.   4. The aortic valve is tricuspid. Aortic valve regurgitation is not  visualized. No aortic stenosis is present.   5. The inferior vena cava is normal in size with greater than 50%  respiratory variability, suggesting right atrial pressure of 3 mmHg.   Comparison(s): No  prior Echocardiogram.   Coronary CTA: 01/2024 IMPRESSION: 1. Coronary calcium  score of 644. This was 55 percentile for age-, sex, and race-matched controls.   2. Normal coronary origin with right dominance.   3. RCA 30-49% mild stenosis proximally, otherwise scattered calcified plaque throughout other vessels.  Carotid Dopplers:  01/2024 Summary:  Right Carotid: Velocities in the right ICA are consistent with a 1-39%  stenosis.                Non-hemodynamically significant plaque <50% noted in the  CCA. The                 ECA appears <50% stenosed.   Left Carotid: Velocities in the left ICA are consistent with a 1-39%  stenosis.               Non-hemodynamically significant plaque <50% noted in the  CCA. The                ECA appears <50% stenosed.    Physical Exam:   VS:  BP 126/72   Pulse 77   Ht 5' 5 (1.651 m)   Wt 178 lb 3.2 oz (80.8 kg)   SpO2 98%   BMI 29.65 kg/m    Wt Readings from Last 3 Encounters:  01/22/24 178 lb 3.2 oz (80.8 kg)  11/19/23 177 lb (80.3 kg)  07/31/23 177 lb (80.3 kg)     GEN: Well nourished, well developed female appearing  in no acute distress NECK: No JVD; No carotid bruits CARDIAC: RRR, no murmurs, rubs, gallops RESPIRATORY:  Clear to auscultation without rales, wheezing or rhonchi  ABDOMEN: Appears non-distended. No obvious abdominal masses. EXTREMITIES: No clubbing or cyanosis. No pitting edema.  Distal pedal pulses are 2+ bilaterally.   Assessment and Plan:   1. 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 She denies any anginal symptoms. Continue with ASA and will titrate statin therapy as discussed below.  2. Labile hypertension - She has undergone extensive workup as discussed above including evaluation by Endocrinology, Neurology, Nephrology, Ophthalmology, ENT and Endocrinology. Workup has overall been reassuring and no causes for secondary HTN.  - Suspect there is a component of autonomic dysfunction given her variable BP and HR. Unfortunately, unable to add Florinef or Midodrine given elevated blood pressure at times. Encouraged her to apply compression stockings upon first waking in the morning and wear these throughout the day and remove at  night. Also reviewed that she could increase sodium intake around the timeframe that her blood pressure drops. She does have follow-up with Endocrinology early next year for reassessment of labs.  3. Bilateral carotid artery stenosis - Recent dopplers earlier this month showed 1 to 39% stenosis bilaterally. Continue ASA and statin therapy. Will titrate Atorvastatin  as discussed below.  4. HLD - Recent FLP in 12/2023 showed her LDL was elevated at 122 but it was recommended to defer adjustment of statin therapy to her PCP given elevated alkaline phosphatase. By review of records, this has been chronically for several years and recent CT Abdomen in 09/2023 showed evidence of prior cholecystectomy with biliary dilatation which was likely postsurgical. - Reviewed options with the patient and will titrate Atorvastatin  from 40mg  daily to 80 mg daily. Repeat FLP and LFT's in 3 months. If intolerant to the higher dose of Atorvastatin , would switch to Crestor.  Signed, Laymon CHRISTELLA Qua, PA-C

## 2024-01-22 ENCOUNTER — Ambulatory Visit: Attending: Student | Admitting: Student

## 2024-01-22 ENCOUNTER — Encounter: Payer: Self-pay | Admitting: Student

## 2024-01-22 ENCOUNTER — Encounter: Payer: Self-pay | Admitting: *Deleted

## 2024-01-22 VITALS — BP 126/72 | HR 77 | Ht 65.0 in | Wt 178.2 lb

## 2024-01-22 DIAGNOSIS — R0989 Other specified symptoms and signs involving the circulatory and respiratory systems: Secondary | ICD-10-CM | POA: Insufficient documentation

## 2024-01-22 DIAGNOSIS — I2510 Atherosclerotic heart disease of native coronary artery without angina pectoris: Secondary | ICD-10-CM | POA: Insufficient documentation

## 2024-01-22 DIAGNOSIS — I6523 Occlusion and stenosis of bilateral carotid arteries: Secondary | ICD-10-CM | POA: Diagnosis present

## 2024-01-22 DIAGNOSIS — E785 Hyperlipidemia, unspecified: Secondary | ICD-10-CM | POA: Diagnosis present

## 2024-01-22 MED ORDER — ATORVASTATIN CALCIUM 80 MG PO TABS: 80.0000 mg | ORAL_TABLET | Freq: Every day | ORAL | 3 refills | Status: AC

## 2024-01-22 NOTE — Patient Instructions (Signed)
 Medication Instructions:   Increase Atorvastatin  to 80mg  daily.   *If you need a refill on your cardiac medications before your next appointment, please call your pharmacy*  Lab Work:  FLP and CMET in 3 months.   If you have labs (blood work) drawn today and your tests are completely normal, you will receive your results only by: MyChart Message (if you have MyChart) OR A paper copy in the mail If you have any lab test that is abnormal or we need to change your treatment, we will call you to review the results.  Follow-Up: At [facility], you and your health needs are our priority.  As part of our continuing mission to provide you with exceptional heart care, our providers are all part of one team.  This team includes your primary Cardiologist (physician) and Advanced Practice Providers or APPs (Physician Assistants and Nurse Practitioners) who all work together to provide you with the care you need, when you need it.  Your next appointment:   1 year(s)  Provider:   You may see Vishnu P Mallipeddi, MD or one of the following Advanced Practice Providers on your designated Care Team:   Antonietta [place], PA-C  [place], [place] Olivia Pavy, [place]     We recommend signing up for the patient portal called MyChart.  Sign up information is provided on this After Visit Summary.  MyChart is used to connect with patients for Virtual Visits (Telemedicine).  Patients are able to view lab/test results, encounter notes, upcoming appointments, etc.  Non-urgent messages can be sent to your provider as well.   To learn more about what you can do with MyChart, go to [email or web address].
